# Patient Record
Sex: Male | Born: 1995 | Race: Black or African American | Hispanic: No | Marital: Single | Smoking: Never smoker
Health system: Southern US, Community
[De-identification: ages and names within clinical notes are randomized; demographics above are authoritative.]

## PROBLEM LIST (undated history)

## (undated) DIAGNOSIS — G43909 Migraine, unspecified, not intractable, without status migrainosus: Secondary | ICD-10-CM

## (undated) DIAGNOSIS — R51 Headache: Secondary | ICD-10-CM

## (undated) DIAGNOSIS — R519 Headache, unspecified: Secondary | ICD-10-CM

## (undated) HISTORY — DX: Headache, unspecified: R51.9

## (undated) HISTORY — DX: Migraine, unspecified, not intractable, without status migrainosus: G43.909

## (undated) HISTORY — DX: Headache: R51

---
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
## 2014-11-27 ENCOUNTER — Ambulatory Visit (INDEPENDENT_AMBULATORY_CARE_PROVIDER_SITE_OTHER): Payer: BC Managed Care – PPO | Admitting: Physician Assistant

## 2014-11-27 ENCOUNTER — Encounter (INDEPENDENT_AMBULATORY_CARE_PROVIDER_SITE_OTHER): Payer: Self-pay

## 2014-11-27 VITALS — BP 130/99 | HR 64 | Temp 98.4°F | Resp 19 | Ht 70.0 in | Wt 180.0 lb

## 2014-11-27 DIAGNOSIS — Y998 Other external cause status: Secondary | ICD-10-CM

## 2014-11-27 DIAGNOSIS — R5383 Other fatigue: Secondary | ICD-10-CM

## 2014-11-27 DIAGNOSIS — R21 Rash and other nonspecific skin eruption: Secondary | ICD-10-CM

## 2014-11-27 DIAGNOSIS — Y939 Activity, unspecified: Secondary | ICD-10-CM

## 2014-11-27 DIAGNOSIS — J309 Allergic rhinitis, unspecified: Secondary | ICD-10-CM

## 2014-11-27 DIAGNOSIS — T679XXA Effect of heat and light, unspecified, initial encounter: Secondary | ICD-10-CM

## 2014-11-27 MED ORDER — TRIAMCINOLONE ACETONIDE 0.025 % EX CREA
TOPICAL_CREAM | Freq: Two times a day (BID) | CUTANEOUS | Status: AC
Start: 2014-11-27 — End: ?

## 2014-11-27 NOTE — Progress Notes (Signed)
Subjective:    Patient ID: Charles Donovan is a 19 y.o. male.    HPI    Pt reports staying in this area for the summer, usually lives in [place]. Pt reports feeling fatigued, nauseated, headache, and skin rash. 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 Pt reports he feels hot flashes when doing this work and once he comes inside. Pt reports that he does not take fluids with him when working. Only gets fluids when he comes in for lunch. Pt has spots of itchy rashes on various parts of body. No chronic conditions, no chronic meds.    The following portions of the patient's history were reviewed and updated as appropriate: allergies, current medications, past medical history, past social history, past surgical history and problem list.    Review of Systems   Constitutional: Positive for activity change and fatigue. Negative for fever.        Hot flashes   Respiratory: Negative for cough.    Cardiovascular: Negative for chest pain.   Gastrointestinal: Positive for nausea. Negative for vomiting and diarrhea.   Skin: Positive for rash.   Neurological: Positive for headaches. Negative for dizziness and light-headedness.   All other systems reviewed and are negative.        Objective:    BP 130/99 mmHg  Pulse 64  Temp(Src) 98.4 F (36.9 C) (Oral)  Resp 19  Ht 1.778 m (5\' 10" )  Wt 81.647 kg (180 lb)  BMI 25.83 kg/m2    Physical Exam   Constitutional: He is oriented to person, place, and time. He appears well-developed and well-nourished. No distress.   HENT:   Head: Normocephalic and atraumatic.   Right Ear: Tympanic membrane and ear canal normal.   Left Ear: Tympanic membrane and ear canal normal.   Mouth/Throat: Uvula is midline and mucous membranes are normal. No oropharyngeal exudate, posterior oropharyngeal edema, posterior oropharyngeal erythema or tonsillar abscesses.   Eyes: EOM are normal. Right eye exhibits no discharge. Left eye  exhibits no discharge. No scleral icterus.   Neck: Normal range of motion.   Cardiovascular: Normal rate, regular rhythm and normal heart sounds.  Exam reveals no gallop and no friction rub.    No murmur heard.  Pulmonary/Chest: Effort normal and breath sounds normal. No accessory muscle usage. No respiratory distress. He has no decreased breath sounds. He has no wheezes. He has no rhonchi. He has no rales.   Musculoskeletal: Normal range of motion.   Lymphadenopathy:     He has no cervical adenopathy.   Neurological: He is alert and oriented to person, place, and time. Coordination normal.   Skin: Skin is warm and dry. Rash noted. No purpura noted. Rash is maculopapular. Rash is not nodular, not vesicular and not urticarial. He is not diaphoretic.        Psychiatric: He has a normal mood and affect. His behavior is normal. Judgment and thought content normal.   Nursing note and vitals reviewed.        Assessment and Plan:       [name] was seen today for hot flashes.    Diagnoses and all orders for this visit:    Fatigue, unspecified type    Rash and nonspecific skin eruption  Orders:  -     triamcinolone (KENALOG) 0.025 % cream; Apply topically 2 (two) times daily.    Heat exposure, initial encounter  Allergic rhinitis, unspecified allergic rhinitis type        Plan: Advised pt that he needs to maintain hydration especially with the heat and working outside. Advised pt that he needs to take fluids with him to the job, and not just drink water at lunch. Push fluids.  Ibuprofen for headache.  Advised adding daily Claritin/Zyrtec/or Allegra for skin allergic response.  Triamcinolone BID PRN for rash.  Follow up with PCP or RTC if there are any new or worsening symptoms or if the symptoms are lasting longer than expected.  Patient/guardian expressed understanding and agreement with plan of care at time of discharge.        Arlys John, PA  [facility]  11/27/2014  1:46 PM

## 2014-11-30 ENCOUNTER — Telehealth (INDEPENDENT_AMBULATORY_CARE_PROVIDER_SITE_OTHER): Payer: Self-pay

## 2014-11-30 NOTE — Telephone Encounter (Signed)
Called to check on patient after recent visit. Patient returned phone call and states that he is feeling better and had no questions or concerns.        Charles Donovan  11/30/2014  3:25 PM

## 2014-11-30 NOTE — Telephone Encounter (Signed)
Called to check on patient after recent visit. Left a message to call back if any questions or concerns.    Charles Donovan  11/30/2014  3:18 PM

## 2015-09-10 ENCOUNTER — Encounter (HOSPITAL_COMMUNITY): Payer: Self-pay | Admitting: *Deleted

## 2015-09-10 ENCOUNTER — Emergency Department (HOSPITAL_COMMUNITY)
Admission: EM | Admit: 2015-09-10 | Discharge: 2015-09-10 | Disposition: A | Discharge status: Home / Self Care | Payer: BC Managed Care – PPO | Attending: Emergency Medicine | Admitting: Emergency Medicine

## 2015-09-10 ENCOUNTER — Emergency Department (HOSPITAL_COMMUNITY): Payer: BC Managed Care – PPO

## 2015-09-10 DIAGNOSIS — R079 Chest pain, unspecified: Secondary | ICD-10-CM | POA: Diagnosis present

## 2015-09-10 DIAGNOSIS — R0781 Pleurodynia: Secondary | ICD-10-CM

## 2015-09-10 MED ORDER — IBUPROFEN 800 MG PO TABS
800.0000 mg | ORAL_TABLET | Freq: Three times a day (TID) | ORAL | Status: DC | PRN
Start: 2015-09-10 — End: 2017-01-08

## 2015-09-10 NOTE — ED Notes (Signed)
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

## 2015-09-10 NOTE — Discharge Instructions (Signed)
Return here as needed.  Follow-up with your team doctor, as they may want to do further testing.  Your x-rays here today were negative.  Use ice and heat on the area that is sore

## 2015-09-10 NOTE — ED Notes (Signed)
Patient returned from xray.

## 2015-09-10 NOTE — ED Provider Notes (Signed)
CSN: [id number]     Arrival date & time 09/10/15  [id number] History  By signing my name below, I, Essence Howell, attest that this documentation has been prepared under the direction and in the presence of Charlestine Nighthristopher Alexzandria Massman, PA-C Electronically Signed: Charline BillsEssence Howell, ED Scribe 09/10/2015 at 10:38 AM   Chief Complaint  Patient presents with  . Chest Pain   The history is provided by the patient. No language interpreter was used.   HPI Comments: Aaron Clements is a 20 y.o. male who presents to the Emergency Department complaining of persistent left rib pain for the past month. Pt suspects that he injured the area playing baseball in the summer. He currently rates pain as 4/10 and describes pain as soreness that is exacerbated with palpation. He has been evaluated by his baseball team's physician and had normal XRs last week. He denies fever, cough, rash.   History reviewed. No pertinent past medical history. History reviewed. No pertinent past surgical history. Family History  Problem Relation Age of Onset  . Colon cancer Maternal Grandmother   . Prostate cancer Maternal Grandfather    Social History  Substance Use Topics  . Smoking status: Never Smoker   . Smokeless tobacco: None  . Alcohol Use: No    Review of Systems  A complete 10 system review of systems was obtained and all systems are negative except as noted in the HPI and PMH.  Allergies  Review of patient's allergies indicates no known allergies.  Home Medications   Prior to Admission medications   Medication Sig Start Date End Date Taking? Authorizing Provider  ibuprofen (ADVIL,MOTRIN) 200 MG tablet Take 200 mg by mouth every 6 (six) hours as needed for pain or headache.    Historical Provider, MD   BP 123/78 mmHg  Pulse 54  Temp(Src) 97.9 F (36.6 C) (Oral)  Resp 14  Ht 5\' 9"  (1.753 m)  Wt 187 lb 9 oz (85.078 kg)  BMI 27.69 kg/m2  SpO2 100% Physical Exam  Constitutional: He is oriented to person, place,  and time. He appears well-developed and well-nourished. No distress.  HENT:  Head: Normocephalic and atraumatic.  Eyes: Conjunctivae and EOM are normal.  Neck: Neck supple. No tracheal deviation present.  Cardiovascular: Normal rate.   Pulmonary/Chest: Effort normal. No respiratory distress.  Pain over the lower rib area on the L in the area of the 11th or 12th rib  Musculoskeletal: Normal range of motion.  Neurological: He is alert and oriented to person, place, and time.  Skin: Skin is warm and dry.  Psychiatric: He has a normal mood and affect. His behavior is normal.  Nursing note and vitals reviewed.  ED Course  Procedures (including critical care time) DIAGNOSTIC STUDIES: Oxygen Saturation is 100% on RA, normal by my interpretation.    COORDINATION OF CARE: 9:49 AM-Discussed treatment plan which includes CXR with pt at bedside and pt agreed to plan.    Imaging Review Dg Ribs Unilateral W/chest Left  09/10/2015  CLINICAL DATA:  Left lower rib pain, right handed pitcher EXAM: LEFT RIBS AND CHEST - 3+ VIEW COMPARISON:  None. FINDINGS: Three views left ribs submitted. No infiltrate or pulmonary edema. No left rib fracture is identified. No pneumothorax. IMPRESSION: Negative. Electronically Signed   By: Natasha MeadLiviu  Pop M.D.   On: 09/10/2015 10:33   I have personally reviewed and evaluated these images and lab results as part of my medical decision-making.  Patient is advised follow-up with his team.  Dr. told to return here as needed.  Patient agrees the plan and all questions were answered  Charlestine Night, PA-C 09/10/15 1558  Derwood Kaplan, MD 09/11/15 [phone]

## 2016-02-04 ENCOUNTER — Encounter: Payer: Self-pay | Admitting: Diagnostic Neuroimaging

## 2016-02-04 ENCOUNTER — Telehealth: Payer: Self-pay | Admitting: Diagnostic Neuroimaging

## 2016-02-04 ENCOUNTER — Ambulatory Visit (INDEPENDENT_AMBULATORY_CARE_PROVIDER_SITE_OTHER): Payer: BC Managed Care – PPO | Admitting: Diagnostic Neuroimaging

## 2016-02-04 VITALS — BP 126/66 | HR 49 | Ht 70.0 in | Wt 189.6 lb

## 2016-02-04 DIAGNOSIS — G43109 Migraine with aura, not intractable, without status migrainosus: Secondary | ICD-10-CM | POA: Diagnosis not present

## 2016-02-04 NOTE — Telephone Encounter (Signed)
Father called requesting to speak to someone, states patient didn't tell us the reason why we saw him, was referred to our office for migraines. Please call.

## 2016-02-04 NOTE — Patient Instructions (Addendum)
Thank you for coming to see [place] at [facility]. I hope we have been able to provide you high quality care today.  You may receive a patient satisfaction survey over the next few weeks. We would appreciate your feedback and comments so that we may continue to improve ourselves and the health of our patients.  - monitor and avoid headache triggers (i.e. Skipping meals, eating chocolate) - follow up with eye doctor for glasses evaluation - use ibuprofen or tylenol as needed for breakthrough headaches  To prevent or relieve headaches, try the following:   Cool Compress. Lie down and place a cool compress on your head.   Avoid headache triggers. If certain foods or odors seem to have triggered your migraines in the past, avoid them. A headache diary might help you identify triggers.   Include physical activity in your daily routine.   Manage stress. Find healthy ways to cope with the stressors, such as delegating tasks on your to-do list.   Practice relaxation techniques. Try deep breathing, yoga, massage and visualization.   Eat regularly. Eating regularly scheduled meals and maintaining a healthy diet might help prevent headaches. Also, drink plenty of fluids.   Follow a regular sleep schedule. Sleep deprivation might contribute to headaches  Consider biofeedback. With this mind-body technique, you learn to control certain bodily functions - such as muscle tension, heart rate and blood pressure - to prevent headaches or reduce headache pain.    ~~~~~~~~~~~~~~~~~~~~~~~~~~~~~~~~~~~~~~~~~~~~~~~~~~~~~~~~~~~~~~~~~  DR. Braya Habermehl'S GUIDE TO HAPPY AND HEALTHY LIVING These are some of my general health and wellness recommendations. Some of them may apply to you better than others. Please use common sense as you try these suggestions and feel free to ask me any questions.   ACTIVITY/FITNESS Mental, social, emotional and physical stimulation are very important for brain  and body health. Try learning a new activity (arts, music, language, sports, games).  Keep moving your body to the best of your abilities. You can do this at home, inside or outside, the park, community center, gym or anywhere you like. Consider a physical therapist or personal trainer to get started. Consider the app Sworkit. Fitness trackers such as smart-watches, smart-phones or Fitbits can help as well.   NUTRITION Eat more plants: colorful vegetables, nuts, seeds and berries.  Eat less sugar, salt, preservatives and processed foods.  Avoid toxins such as cigarettes and alcohol.  Drink water when you are thirsty. Warm water with a slice of lemon is an excellent morning drink to start the day.  Consider these websites for more information The Nutrition Source (https://www.henry-hernandez.biz/) Precision Nutrition (WindowBlog.ch)   RELAXATION Consider practicing mindfulness meditation or other relaxation techniques such as deep breathing, prayer, yoga, tai chi, massage. See website mindful.org or the apps Headspace or Calm to help get started.   SLEEP Try to get at least 7-8+ hours sleep per day. Regular exercise and reduced caffeine will help you sleep better. Practice good sleep hygeine techniques. See website sleep.org for more information.   PLANNING Prepare estate planning, living will, healthcare POA documents. Sometimes this is best planned with the help of an attorney. Theconversationproject.org and agingwithdignity.org are excellent resources.

## 2016-02-04 NOTE — Telephone Encounter (Signed)
Unable to call father. No number listed, and he is not on patient's DPR form. Not authorized by patient to speak with father.

## 2016-02-04 NOTE — Progress Notes (Signed)
[facility]  PATIENT: Aaron Clements DOB: 1995-09-19  REFERRING CLINICIAN: Quita Skye James, NP HISTORY FROM: patient  REASON FOR VISIT: new consult    HISTORICAL  CHIEF COMPLAINT:  Chief Complaint  Patient presents with  . Headache    rm 7, New Pt, "eyes when I sleep I see white, bright lights, and it's hard to wake up from; ears ringing; concussion age 89"    HISTORY OF PRESENT ILLNESS:   20 year old male, right-handed, here for evaluation of headaches. Since age 20 years old, following concussion while attending cousins football game and struck in the face with kneepad, leading to loss of consciousness, nausea, vomiting and overnight hospital stay, patient has had intermittent headaches. Patient was headaches all throughout his life, mainly with left-sided throbbing pounding headaches associated with photophobia, left ptosis, left eye pain, left blurred vision. Sometimes these headaches are preceded by seeing "flashing bright lights closed". Triggering factors would include eating chocolate or skipping meals. Headaches can last 1-2 hours or longer this time. Headaches occur approximately 1 day per week. Patient usually takes ibuprofen and lays down with good relief.  No family history of headaches or migraine. No prior diagnosis of migraine.   REVIEW OF SYSTEMS: Full 14 system review of systems performed and negative with exception of: Headache eye pain ringing in ears.  ALLERGIES: No Known Allergies  HOME MEDICATIONS: Outpatient Medications Prior to Visit  Medication Sig Dispense Refill  . ibuprofen (ADVIL,MOTRIN) 800 MG tablet Take 1 tablet (800 mg total) by mouth every 8 (eight) hours as needed. 21 tablet 0   No facility-administered medications prior to visit.     PAST MEDICAL HISTORY: Past Medical History:  Diagnosis Date  . Headache     PAST SURGICAL HISTORY: History reviewed. No pertinent surgical history.  FAMILY HISTORY: Family History    Problem Relation Age of Onset  . Colon cancer Maternal Grandmother   . Prostate cancer Maternal Grandfather     SOCIAL HISTORY:  Social History   Social History  . Marital status: Single    Spouse name: N/A  . Number of children: 0  . Years of education: 15   Occupational History  . student     [employer], criminal justice   Social History Main Topics  . Smoking status: Never Smoker  . Smokeless tobacco: Current User    Types: Chew     Comment: chew off anf on   . Alcohol use No  . Drug use: No  . Sexual activity: Not on file   Other Topics Concern  . Not on file   Social History Narrative   Lives with mother   No caffeine     PHYSICAL EXAM  GENERAL EXAM/CONSTITUTIONAL: Vitals:  Vitals:   02/04/16 0859  BP: 126/66  Pulse: (!) 49  Weight: 189 lb 9.6 oz (86 kg)  Height: 5\' 10"  (1.778 m)     Body mass index is 27.2 kg/m.  Visual Acuity Screening   Right eye Left eye Both eyes  Without correction: 20/20 20/30   With correction:        Patient is in no distress; well developed, nourished and groomed; neck is supple  CARDIOVASCULAR:  Examination of carotid arteries is normal; no carotid bruits  Regular rate and rhythm, no murmurs  Examination of peripheral vascular system by observation and palpation is normal  EYES:  Ophthalmoscopic exam of optic discs and posterior segments is normal; no papilledema or hemorrhages  MUSCULOSKELETAL:  Gait,  strength, tone, movements noted in Neurologic exam below  NEUROLOGIC: MENTAL STATUS:  No flowsheet data found.  awake, alert, oriented to person, place and time  recent and remote memory intact  normal attention and concentration  language fluent, comprehension intact, naming intact,   fund of knowledge appropriate  CRANIAL NERVE:   2nd - no papilledema on fundoscopic exam  2nd, 3rd, 4th, 6th - pupils equal and reactive to light, visual fields full to confrontation, extraocular muscles  intact, no nystagmus  5th - facial sensation symmetric  7th - facial strength symmetric  8th - hearing intact  9th - palate elevates symmetrically, uvula midline  11th - shoulder shrug symmetric  12th - tongue protrusion midline  MOTOR:   normal bulk and tone, full strength in the BUE, BLE  SENSORY:   normal and symmetric to light touch, temperature, vibration  COORDINATION:   finger-nose-finger, fine finger movements normal  REFLEXES:   deep tendon reflexes present and symmetric  GAIT/STATION:   narrow based gait; able to walk tandem; romberg is negative    DIAGNOSTIC DATA (LABS, IMAGING, TESTING) - I reviewed patient records, labs, notes, testing and imaging myself where available.  No results found for: WBC, HGB, HCT, MCV, PLT No results found for: NA, K, CL, CO2, GLUCOSE, BUN, CREATININE, CALCIUM, PROT, ALBUMIN, AST, ALT, ALKPHOS, BILITOT, GFRNONAA, GFRAA No results found for: CHOL, HDL, LDLCALC, LDLDIRECT, TRIG, CHOLHDL No results found for: ZOXW9U No results found for: VITAMINB12 No results found for: TSH     ASSESSMENT AND PLAN  20 y.o. year old male here with migraine with aura since age 41 years old following concussion. Neurologic examination is unremarkable. Most likely represents migraine with aura. No further neuroimaging or testing advised. Offered options for migraine prevention migraine rescue medication. Patient feels comfortable using ibuprofen and avoiding triggering factors. If symptoms change or worsen patient may return for further evaluation.   Dx:  1. Migraine with aura and without status migrainosus, not intractable      PLAN: - monitor and avoid headache triggers (i.e. Skipping meals, eating chocolate) - follow up with eye doctor for glasses evaluation - use ibuprofen or tylenol as needed for breakthrough headaches  No orders of the defined types were placed in this encounter.   No orders of the defined types were placed in  this encounter.   Return if symptoms worsen or fail to improve, for return to PCP.    Suanne Marker, MD 02/04/2016, 9:36 AM Certified in Neurology, Neurophysiology and Neuroimaging  [facility] [address], [address] [place], [place] [address] [phone]

## 2016-02-05 NOTE — Telephone Encounter (Signed)
Pt's father called back. I relayed to him that he is not on DPR so we can not speak to him about his son's care. I told him the pt will have to come to the office to fill and sign form. He expressed understanding

## 2017-01-07 ENCOUNTER — Encounter (HOSPITAL_BASED_OUTPATIENT_CLINIC_OR_DEPARTMENT_OTHER): Payer: Self-pay | Admitting: Emergency Medicine

## 2017-01-07 ENCOUNTER — Emergency Department (HOSPITAL_BASED_OUTPATIENT_CLINIC_OR_DEPARTMENT_OTHER)
Admission: EM | Admit: 2017-01-07 | Discharge: 2017-01-07 | Disposition: A | Discharge status: Home / Self Care | Payer: Worker's Compensation | Attending: Emergency Medicine | Admitting: Emergency Medicine

## 2017-01-07 DIAGNOSIS — F17220 Nicotine dependence, chewing tobacco, uncomplicated: Secondary | ICD-10-CM | POA: Diagnosis not present

## 2017-01-07 DIAGNOSIS — Y939 Activity, unspecified: Secondary | ICD-10-CM | POA: Diagnosis not present

## 2017-01-07 DIAGNOSIS — W228XXA Striking against or struck by other objects, initial encounter: Secondary | ICD-10-CM | POA: Diagnosis not present

## 2017-01-07 DIAGNOSIS — Y929 Unspecified place or not applicable: Secondary | ICD-10-CM | POA: Insufficient documentation

## 2017-01-07 DIAGNOSIS — Y990 Civilian activity done for income or pay: Secondary | ICD-10-CM | POA: Insufficient documentation

## 2017-01-07 DIAGNOSIS — S060X0A Concussion without loss of consciousness, initial encounter: Secondary | ICD-10-CM

## 2017-01-07 DIAGNOSIS — S0990XA Unspecified injury of head, initial encounter: Secondary | ICD-10-CM | POA: Diagnosis present

## 2017-01-07 NOTE — ED Notes (Signed)
Pt reports feeling dizzy upon exertion not while resting.

## 2017-01-07 NOTE — ED Notes (Signed)
Pt verbalizes understanding of d/c instructions and denies any further needs at this time. 

## 2017-01-07 NOTE — ED Triage Notes (Signed)
Pt reports he was hit in the L side of the head with a metal latch at work this morning. Denies LOC. Swelling noted. Pt reports pain and dizziness.

## 2017-01-07 NOTE — ED Provider Notes (Signed)
[facility] Provider Note   CSN: [id number] Arrival date & time: 01/07/17  4098  By signing my name below, I, Diona Browner, attest that this documentation has been prepared under the direction and in the presence of Tilden Fossa, MD. Electronically Signed: Diona Browner, ED Scribe. 01/07/17. 9:10 PM.  History   Chief Complaint Chief Complaint  Patient presents with  . Head Injury    HPI Aaron Clements is a 21 y.o. male who presents to the Emergency Department complaining of a mild HA that started this morning after he was hit in the head by a metal latch at work. Pt reports feeling nauseous earlier in the day. Notes he hasn't eaten since lunch, but he currently feels better, and would like to go home. Pt denies LOC, vomiting, numbness, weakness, or any other complaints at this time.   The history is provided by the patient and a parent (Mother). No language interpreter was used.    Past Medical History:  Diagnosis Date  . Headache     Patient Active Problem List   Diagnosis Date Noted  . Unspecified constipation 12/19/2012    History reviewed. No pertinent surgical history.     Home Medications    Prior to Admission medications   Medication Sig Start Date End Date Taking? Authorizing Provider  ibuprofen (ADVIL,MOTRIN) 800 MG tablet Take 1 tablet (800 mg total) by mouth every 8 (eight) hours as needed. 09/10/15   Charlestine Night, PA-C    Family History Family History  Problem Relation Age of Onset  . Colon cancer Maternal Grandmother   . Prostate cancer Maternal Grandfather     Social History Social History  Substance Use Topics  . Smoking status: Never Smoker  . Smokeless tobacco: Current User    Types: Chew     Comment: chew off anf on   . Alcohol use No     Allergies   Patient has no known allergies.   Review of Systems Review of Systems  Gastrointestinal: Positive for nausea. Negative for vomiting.  Neurological:  Positive for headaches. Negative for syncope, weakness and numbness.  All other systems reviewed and are negative.    Physical Exam Updated Vital Signs BP 132/75 (BP Location: Left Arm)   Pulse (!) 48   Temp 98.4 F (36.9 C) (Oral)   Resp 16   Ht 5\' 9"  (1.753 m)   Wt 88.5 kg (195 lb)   SpO2 100%   BMI 28.80 kg/m   Physical Exam  Constitutional: He is oriented to person, place, and time. He appears well-developed and well-nourished.  HENT:  Head: Normocephalic.  Right Ear: Tympanic membrane normal.  Left Ear: Tympanic membrane normal.  Mild swelling and tenderness to left parietal region.  Eyes: Pupils are equal, round, and reactive to light. EOM are normal.  Cardiovascular: Normal rate and regular rhythm.   No murmur heard. Pulmonary/Chest: Effort normal and breath sounds normal. No respiratory distress.  Musculoskeletal: He exhibits no edema or tenderness.  Neurological: He is alert and oriented to person, place, and time.  Skin: Skin is warm and dry.  Psychiatric: He has a normal mood and affect. His behavior is normal.  Nursing note and vitals reviewed.    ED Treatments / Results  DIAGNOSTIC STUDIES: Oxygen Saturation is 100% on RA, normal by my interpretation.   COORDINATION OF CARE: 9:10 PM-Discussed next steps with pt which includes taking tylenol or ibuprofen for pain. If his sx worsen or change he is to  return for a CT scan. He is advised to stay home from work and will be given a note. Pt verbalized understanding and is agreeable with the plan.   Labs (all labs ordered are listed, but only abnormal results are displayed) Labs Reviewed - No data to display  EKG  EKG Interpretation None       Radiology No results found.  Procedures Procedures (including critical care time)  Medications Ordered in ED Medications - No data to display   Initial Impression / Assessment and Plan / ED Course  I have reviewed the triage vital signs and the nursing  notes.  Pertinent labs & imaging results that were available during my care of the patient were reviewed by me and considered in my medical decision making (see chart for details).     Patient here for evaluation following a head injury. No concerning features for serious closed head injury. Discussed with patient and home care for concussion. Discussed outpatient follow-up and return precautions.  Final Clinical Impressions(s) / ED Diagnoses   Final diagnoses:  Concussion without loss of consciousness, initial encounter    New Prescriptions Discharge Medication List as of 01/07/2017  9:11 PM     I personally performed the services described in this documentation, which was scribed in my presence. The recorded information has been reviewed and is accurate.    Tilden Fossaees, Latanga Nedrow, MD 01/08/17 [phone]

## 2017-01-08 ENCOUNTER — Ambulatory Visit (INDEPENDENT_AMBULATORY_CARE_PROVIDER_SITE_OTHER): Payer: BC Managed Care – PPO | Admitting: Family Medicine

## 2017-01-08 ENCOUNTER — Encounter: Payer: Self-pay | Admitting: Family Medicine

## 2017-01-08 VITALS — BP 116/70 | HR 56 | Temp 98.5°F | Ht 69.0 in | Wt 183.4 lb

## 2017-01-08 DIAGNOSIS — S060X0D Concussion without loss of consciousness, subsequent encounter: Secondary | ICD-10-CM | POA: Diagnosis not present

## 2017-01-08 DIAGNOSIS — Z7689 Persons encountering health services in other specified circumstances: Secondary | ICD-10-CM

## 2017-01-08 DIAGNOSIS — S060X9A Concussion with loss of consciousness of unspecified duration, initial encounter: Secondary | ICD-10-CM | POA: Insufficient documentation

## 2017-01-08 DIAGNOSIS — S060XAA Concussion with loss of consciousness status unknown, initial encounter: Secondary | ICD-10-CM | POA: Insufficient documentation

## 2017-01-08 NOTE — Patient Instructions (Addendum)
Concussion, Adult A concussion is a brain injury from a direct hit (blow) to the head or body. This blow causes the brain to shake quickly back and forth inside the skull. This can damage brain cells and cause chemical changes in the brain. A concussion may also be known as a mild traumatic brain injury (TBI). Concussions are usually not life-threatening, but the effects of a concussion can be serious. If you have a concussion, you are more likely to experience concussion-like symptoms after a direct blow to the head in the future. What are the causes? This condition is caused by:  A direct blow to the head, such as from running into another player during a game, being hit in a fight, or hitting your head on a hard surface.  A jolt of the head or neck that causes the brain to move back and forth inside the skull, such as in a car crash.  What are the signs or symptoms? The signs of a concussion can be hard to notice. Early on, they may be missed by you, family members, and health care providers. You may look fine but act or feel differently. Symptoms are usually temporary, but they may last for days, weeks, or even longer. Some symptoms may appear right away but other symptoms may not show up for hours or days. Every head injury is different. Symptoms may include:  Headaches. This can include a feeling of pressure in the head.  Memory problems.  Trouble concentrating, organizing, or making decisions.  Slowness in thinking, acting or reacting, speaking, or reading.  Confusion.  Fatigue.  Changes in eating or sleeping patterns.  Problems with coordination or balance.  Nausea or vomiting.  Numbness or tingling.  Sensitivity to light or noise.  Vision or hearing problems.  Reduced sense of smell.  Irritability or mood changes.  Dizziness.  Lack of motivation.  Seeing or hearing things that other people do not see or hear (hallucinations).  How is this diagnosed? This  condition is diagnosed based on:  Your symptoms.  A description of your injury.  You may also have tests, including:  Imaging tests, such as a CT scan or MRI. These are done to look for signs of brain injury.  Neuropsychological tests. These measure your thinking, understanding, learning, and remembering abilities.  How is this treated? This condition is treated with physical and mental rest and careful observation, usually at home. If the concussion is severe, you may need to stay home from work for a while. You may be referred to a concussion clinic or to other health care providers for management. It is important that you tell your health care provider if:  You are taking any medicines, including prescription medicines, over-the-counter medicines, and natural remedies. Some medicines, such as blood thinners (anticoagulants) and aspirin, may increase the chance of complications, such as bleeding.  You are taking or have taken alcohol or illegal drugs. Alcohol and certain other drugs may slow your recovery and can put you at risk of further injury.  How fast you will recover from a concussion depends on many factors, such as how severe your concussion is, what part of your brain was injured, how old you are, and how healthy you were before the concussion. Recovery can take time. It is important to wait to return to activity until a health care provider says it is safe to do that and your symptoms are completely gone. Follow these instructions at home: Activity  Limit activities that   require a lot of thought or concentration. These may include: ? Doing homework or job-related work. ? Watching TV. ? Working on the computer. ? Playing memory games and puzzles.  Rest. Rest helps the brain to heal. Make sure you: ? Get plenty of sleep at night. Avoid staying up late at night. ? Keep the same bedtime hours on weekends and weekdays. ? Rest during the day. Take naps or rest breaks when you  feel tired.  Having another concussion before the first one has healed can be dangerous. Do not do high-risk activities that could cause a second concussion, such as riding a bicycle or playing sports.  Ask your health care provider when you can return to your normal activities, such as school, work, athletics, driving, riding a bicycle, or using heavy machinery. Your ability to react may be slower after a brain injury. Never do these activities if you are dizzy. Your health care provider will likely give you a plan for gradually returning to activities. General instructions  Take over-the-counter and prescription medicines only as told by your health care provider.  Do not drink alcohol until your health care provider says you can.  If it is harder than usual to remember things, write them down.  If you are easily distracted, try to do one thing at a time. For example, do not try to watch TV while fixing dinner.  Talk with family members or close friends when making important decisions.  Watch your symptoms and tell others to do the same. Complications sometimes occur after a concussion. Older adults with a brain injury may have a higher risk of serious complications, such as a blood clot in the brain.  Tell your teachers, school nurse, school counselor, coach, athletic trainer, or work manager about your injury, symptoms, and restrictions. Tell them about what you can or cannot do. They should watch for: ? Increased problems with attention or concentration. ? Increased difficulty remembering or learning new information. ? Increased time needed to complete tasks or assignments. ? Increased irritability or decreased ability to cope with stress. ? Increased symptoms.  Keep all follow-up visits as told by your health care provider. This is important. How is this prevented? It is very important to avoid another brain injury, especially as you recover. In rare cases, another injury can lead  to permanent brain damage, brain swelling, or death. The risk of this is greatest during the first 7-10 days after a head injury. Avoid injuries by:  Wearing a seat belt when riding in a car.  Wearing a helmet when biking, skiing, skateboarding, skating, or doing similar activities.  Avoiding activities that could lead to a second concussion, such as contact or recreational sports, until your health care provider says it is okay.  Taking safety measures in your home, such as: ? Removing clutter and tripping hazards from floors and stairways. ? Using grab bars in bathrooms and handrails by stairs. ? Placing non-slip mats on floors and in bathtubs. ? Improving lighting in dim areas.  Contact a health care provider if:  Your symptoms get worse.  You have new symptoms.  You continue to have symptoms for more than 2 weeks. Get help right away if:  You have severe or worsening headaches.  You have weakness or numbness in any part of your body.  Your coordination gets worse.  You vomit repeatedly.  You are sleepier.  The pupil of one eye is larger than the other.  You have convulsions or a   seizure.  Your speech is slurred.  Your fatigue, confusion, or irritability gets worse.  You cannot recognize people or places.  You have neck pain.  It is difficult to wake you up.  You have unusual behavior changes.  You lose consciousness. Summary  A concussion is a brain injury from a direct hit (blow) to the head or body.  A concussion may also be called a mild traumatic brain injury (TBI).  You may have imaging tests and neuropsychological tests to diagnose a concussion.  This condition is treated with physical and mental rest and careful observation.  Ask your health care provider when you can return to your normal activities, such as school, work, athletics, driving, riding a bicycle, or using heavy machinery. Follow safety instructions as told by your health care  provider. This information is not intended to replace advice given to you by your health care provider. Make sure you discuss any questions you have with your health care provider. Document Released: 08/08/2003 Document Revised: 04/28/2016 Document Reviewed: 04/28/2016 Elsevier Interactive Patient Education  2017 [employer]     The [facility] has a sports medicine clinic that has a concussion clinic.  The number to the office is [phone].  You should expect a phone call from them on Monday, but if you have not heard from them by the afternoon please give them a call.  If you have any worsening symptoms over the weekend please return to the urgent care clinic or go to the ED for further evaluation.

## 2017-01-08 NOTE — Progress Notes (Signed)
Subjective:    Patient ID: Aaron Clements, male    DOB: Sep 19, 1995, 21 y.o.   MRN: [id number]  Chief Complaint  Patient presents with  . Establish Care    HPI Patient is in today to establish care.  Pt previously seen on [facility] campus.  Pt states he has been healthy.  Patient endorses headaches.  States may have 1-2 per week. They can last all day. May take Tylenol 500 mg and go to sleep.  Of note yesterday patient was seen by urgent care after being injured at work. Patient states a metal lock fell on his head. Pt denies LOC.  Patient states he was diagnosed with a concussion. Patient states a CT scan was not done. Patient states today he feels okay but his head feels weird. Patient denies nausea/vomiting or new headaches.   Patient also presents for form completion. 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   Social history: Patient currently works for [employer] in [place].  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 Patient denies tobacco, drug or ethanol use.  Surgical history: None  Allergies: No known drug allergies, no known food allergies  Family medical history: Maternal grandfather alive history of prostate cancer Maternal grandmother alive history of colon cancer Mother alive and well Father alive and well Older Sister alive and well  Past Medical History:  Diagnosis Date  . Headache   . Migraines     History reviewed. No pertinent surgical history.  Family History  Problem Relation Age of Onset  . Colon cancer Maternal Grandmother   . Cancer Maternal Grandmother   . Prostate cancer Maternal Grandfather   . Cancer Maternal Grandfather     Social History   Social History  . Marital status: Single    Spouse name: N/A  . Number of children: 0  . Years of education: 15    Occupational History  . student     [employer], criminal justice  . [employer]   Social History Main Topics  . Smoking status: Never Smoker  . Smokeless tobacco: Current User    Types: Chew     Comment: chew off anf on   . Alcohol use No  . Drug use: No  . Sexual activity: Not on file   Other Topics Concern  . Not on file   Social History Narrative   Lives with mother   No caffeine    Outpatient Medications Prior to Visit  Medication Sig Dispense Refill  . ibuprofen (ADVIL,MOTRIN) 800 MG tablet Take 1 tablet (800 mg total) by mouth every 8 (eight) hours as needed. 21 tablet 0   No facility-administered medications prior to visit.     No Known Allergies  ROS  General: Denies fever, chills, night sweats, changes in weight, changes in appetite   HEENT: Denies ear pain, changes in vision, rhinorrhea, sore throat    + headaches, concussion CV: Denies CP, palpitations, SOB, orthopnea Pulm: Denies SOB, cough, wheezing GI: Denies abdominal pain, nausea, vomiting, diarrhea, constipation GU: Denies dysuria, hematuria, frequency, vaginal discharge Msk: Denies muscle cramps, joint pains Neuro: Denies weakness, numbness, tingling Skin: Denies rashes, bruising Psych: Denies depression, anxiety, hallucinations      Objective:    Blood pressure 116/70, pulse (!) 56, temperature 98.5 F (36.9 C), temperature source Oral, height  5\' 9"  (1.753 m), weight 183 lb 6.4 oz (83.2 kg), SpO2 98 %.   Gen. Pleasant, well-nourished, in no distress, normal affect  HEENT -[place]/AT, no lesions, face symmetric, no scleral icterus, PERRLA, EOMI, no post nasal drip  Neck: No JVD, no thyromegaly, no carotid bruits Lungs: no accessory muscle uss, CTAB, no wheezes or rales Cardiovascular: RR, heart sounds  normal, no m/r/g, no peripheral edema Abdomen: soft and non-tender, no hepatosplenomegaly, BS normal. Musculoskeletal: No deformities, no cyanosis or clubbing, normal tone Neuro:  A&Ox3, CN  II-XII intact, normal gait,  Skin:  Warm, no lesions/ rash   Wt Readings from Last 3 Encounters:  01/08/17 183 lb 6.4 oz (83.2 kg)  01/07/17 195 lb (88.5 kg)  02/04/16 189 lb 9.6 oz (86 kg)    Diabetic Foot Exam - Simple   No data filed     No results found for: WBC, HGB, HCT, PLT, GLUCOSE, CHOL, TRIG, HDL, LDLDIRECT, LDLCALC, ALT, AST, NA, K, CL, CREATININE, BUN, CO2, TSH, PSA, INR, GLUF, HGBA1C, MICROALBUR  Assessment/Plan: 21 yo healthy, AAM presents to establish care, recent concussion.  Encounter to establish care -Encouraged to follow-up as needed -We will discuss flu shot at next visit.  Mild concussion, subsequent encounter -Stable -We'll have patient follow-up in concussion clinic with sports medicine [facility].  Message left for their staff to contact pt on Monday.  Pt also given clinics phone number to check on appointment. -Patient's form for work  to be completed after completion of concussion protocol. -Patient advised if symptoms become worse has nausea or vomiting new headache, to return to urgent care or the emergency department.

## 2017-01-11 ENCOUNTER — Telehealth: Payer: Self-pay

## 2017-01-11 NOTE — Telephone Encounter (Signed)
Patient returned call. He suffered a head injury last week around the 8th of August while at work. He works and [employer] and was unloading a truck when a flap came down and hit him in the head on the left temporal region. He did not lose consciousness but did go home. He developed a headache, nausea, and felt light headed. These symptoms lasted for 2 days and then all symptoms resolved. He returned to work on Saturday and did not have reoccurrence of symptoms with the physical activity of his job. He does have a prior history of concussion when he was younger. Recommended to patient to refrain from situations in which he would possibly his his head again as best he can while at work. Patient voices understanding. We will see him on Wednesday.

## 2017-01-11 NOTE — Telephone Encounter (Signed)
Dr[facility] Salomon [facility]ickBanks [facility] called to refer Aaron CollegeKelvin to the Concussion Clinic. Called patient to schedule. Left message to call back.

## 2017-01-13 ENCOUNTER — Ambulatory Visit: Payer: Self-pay | Admitting: Family Medicine

## 2017-01-18 ENCOUNTER — Encounter (HOSPITAL_COMMUNITY): Payer: Self-pay | Admitting: *Deleted

## 2017-01-18 ENCOUNTER — Emergency Department (HOSPITAL_COMMUNITY)
Admission: EM | Admit: 2017-01-18 | Discharge: 2017-01-18 | Disposition: A | Discharge status: Home / Self Care | Payer: BC Managed Care – PPO | Attending: Emergency Medicine | Admitting: Emergency Medicine

## 2017-01-18 DIAGNOSIS — L03011 Cellulitis of right finger: Secondary | ICD-10-CM | POA: Insufficient documentation

## 2017-01-18 DIAGNOSIS — M79644 Pain in right finger(s): Secondary | ICD-10-CM | POA: Diagnosis present

## 2017-01-18 MED ORDER — LIDOCAINE HCL 2 % IJ SOLN
10.0000 mL | Freq: Once | INTRAMUSCULAR | Status: DC
  Filled 2017-01-18: qty 10

## 2017-01-18 MED ORDER — LIDOCAINE HCL (PF) 2 % IJ SOLN
10.0000 mL | Freq: Once | INTRAMUSCULAR | Status: AC
  Administered 2017-01-18: 10 mL
  Filled 2017-01-18: qty 10

## 2017-01-18 MED ORDER — SULFAMETHOXAZOLE-TRIMETHOPRIM 800-160 MG PO TABS: 1.0000 | ORAL_TABLET | Freq: Two times a day (BID) | ORAL | 0 refills | Status: DC

## 2017-01-18 NOTE — Discharge Instructions (Signed)
Return if any problems.

## 2017-01-18 NOTE — ED Triage Notes (Signed)
Pt has swelling and pain on middle finger rt hand, along lateral nailbed. Tender to touch

## 2017-01-18 NOTE — ED Notes (Signed)
Telfa dressing applied to finger

## 2017-01-18 NOTE — ED Provider Notes (Signed)
[facility]-EMERGENCY DEPT Provider Note   CSN: [id number] Arrival date & time: 01/18/17  2011     History   Chief Complaint Chief Complaint  Patient presents with  . Finger Injury    HPI Aaron Clements is a 21 y.o. male.  The history is provided by the patient. No language interpreter was used.  Hand Pain  This is a new problem. The current episode started more than 2 days ago. The problem occurs constantly. The problem has been gradually worsening. Nothing aggravates the symptoms. Nothing relieves the symptoms. He has tried nothing for the symptoms. The treatment provided no relief.  Pt complains of swelling around finger  Past Medical History:  Diagnosis Date  . Headache   . Migraines     Patient Active Problem List   Diagnosis Date Noted  . Mild concussion 01/08/2017  . Unspecified constipation 12/19/2012    History reviewed. No pertinent surgical history.     Home Medications    Prior to Admission medications   Medication Sig Start Date End Date Taking? Authorizing Provider  Acetaminophen (TYLENOL PO) Take by mouth as needed.    [provider]    Family History Family History  Problem Relation Age of Onset  . Colon cancer Maternal Grandmother   . Cancer Maternal Grandmother   . Prostate cancer Maternal Grandfather   . Cancer Maternal Grandfather     Social History Social History  Substance Use Topics  . Smoking status: Never Smoker  . Smokeless tobacco: Never Used  . Alcohol use No     Allergies   Patient has no known allergies.   Review of Systems Review of Systems  All other systems reviewed and are negative.    Physical Exam Updated Vital Signs BP (!) 145/83 (BP Location: Left Arm)   Pulse (!) 50   Temp 98.5 F (36.9 C) (Oral)   Resp 16   Ht 5\' 9"  (1.753 m)   Wt 83.9 kg (185 lb)   SpO2 99%   BMI 27.32 kg/m   Physical Exam  Constitutional: He is oriented to person, place, and time. He appears well-developed and  well-nourished.  Musculoskeletal: Normal range of motion.  Neurological: He is alert and oriented to person, place, and time.  Skin: Skin is warm.  Psychiatric: He has a normal mood and affect.  Nursing note and vitals reviewed.    ED Treatments / Results  Labs (all labs ordered are listed, but only abnormal results are displayed) Labs Reviewed - No data to display  EKG  EKG Interpretation None       Radiology No results found.  Procedures .Marland KitchenIncision and Drainage Date/Time: 01/22/2017 4:10 PM Performed by: Elson Areas Authorized by: Elson Areas   Consent:    Consent obtained:  Verbal   Consent given by:  Patient   Risks discussed:  Incomplete drainage   Alternatives discussed:  No treatment Location:    Type:  Abscess   Location:  Upper extremity Pre-procedure details:    Skin preparation:  Betadine Anesthesia (see MAR for exact dosages):    Anesthesia method:  Local infiltration Procedure type:    Complexity:  Simple Procedure details:    Scalpel blade:  11   Wound treatment:  Wound left open   (including critical care time)  Medications Ordered in ED Medications  lidocaine (XYLOCAINE) 2 % (with pres) injection 200 mg (not administered)     Initial Impression / Assessment and Plan / ED Course  I have reviewed the triage vital signs and the nursing notes.  Pertinent labs & imaging results that were available during my care of the patient were reviewed by me and considered in my medical decision making (see chart for details).       Final Clinical Impressions(s) / ED Diagnoses   Final diagnoses:  Paronychia of right index finger    New Prescriptions New Prescriptions   No medications on file  Scheduled Meds: Continuous Infusions: PRN Meds:. An After Visit Summary was printed and given to the patient.    Elson Areas, [place] 01/22/17 1610    Pricilla Loveless, MD 01/25/17 [phone]

## 2017-01-19 NOTE — Progress Notes (Deleted)
Subjective:   @VITALSMCOMMENTS @  Chief Complaint: Aaron Clements, DOB: 31-Jul-1995, is a 21 y.o. male who presents for No chief complaint on file.   Injury date : *** Visit #: ***  History of Present Illness:   Patient's goals/priorities: ***  CLASS CURRENT GRADE COMMENTS                               Concussion Self-Reported Symptom Score Symptoms rated on a scale 1-6, in last 24 hours  Headache: ***    Nausea: ***  Vomiting: ***  Balance Difficulty: ***   Dizziness: ***  Fatigue: ***  Trouble Falling Asleep: ***   Sleep More Than Usual: ***  Sleep Less Than Usual: ***  Daytime Drowsiness: ***  Photophobia: ***  Phonophobia: ***  Feeling anxious: ***  Confused: ***  Irritability: ***  Sadness: ***  Nervousness: ***  Feeling More Emotional: ***  Numbness or Tingling: ***  Feeling Slowed Down: ***  Feeling Mentally Foggy: ***  Difficulty Concentrating: ***  Difficulty Remembering: ***  Visual Problems: ***  Neck Pain: ***  Tinnitus: ***   Total Symptom Score: *** Previous Symptom Score: ***  Review of Systems: Pertinent items are noted in HPI.  Review of History: Past Medical History: @PMHP @  Past Surgical History:  has no past surgical history on file. Family History: family history includes Cancer in his maternal grandfather and maternal grandmother; Colon cancer in his maternal grandmother; Prostate cancer in his maternal grandfather. Social History:  reports that he has never smoked. He has never used smokeless tobacco. He reports that he does not drink alcohol or use drugs. Current Medications: has a current medication list which includes the following prescription(s): acetaminophen and sulfamethoxazole-trimethoprim. Allergies: has No Known Allergies.  Objective:    Physical Examination There were no vitals filed for this visit. General appearance: alert, appears stated age and cooperative Head: Normocephalic, without obvious  abnormality, atraumatic Eyes: conjunctivae/corneas clear. PERRL, EOM's intact. Fundi benign. Sclera anicteric. Lungs: clear to auscultation bilaterally and percussion Heart: regular rate and rhythm, S1, S2 normal, no murmur, click, rub or gallop Neurologic: CN 2-12 normal.  Sensation to pain, touch, and proprioception normal.  DTRs  normal in upper and lower extremities. No pathologic reflexes. Neg rhomberg, modified rhomberg, pronator drift, tandem gait, finger-to-nose; see post-concussion vestibular and oculomotor testing in chart Psychiatric: Oriented X3, intact recent and remote memory, judgement and insight, normal mood and affect  Concussion testing performed today:  Neurocognitive testing (ImPACT):  Baseline:*** Post #1: ***   Verbal Memory Composite *** (***%) *** (***%)   Visual Memory Composite *** (***%) *** (***%)   Visual Motor Speed Composite *** (***%) *** (***%)   Reaction Time Composite *** (***%) *** (***%)   Cognitive Efficiency Index *** ***     Vestibular Screening:   Pre VOMS  HA Score: *** Pre VOMS  Dizziness Score: ***   Headache  Dizziness  Smooth Pursuits *** ***  H. Saccades *** ***  V. Saccades *** ***  H. VOR *** ***  V. VOR *** ***  Visual Motor Sensitivity *** ***  Accommodation Right: *** cm Left: *** cm *** ***  Convergence: *** cm Divergence: *** cm *** ***   Balance Screen: ***  Additional testing performed today: { :28529}   Assessment:    No diagnosis found.  Aaron Clements presents with the following concussion subtypes. [] Cognitive [] Cervical [] Vestibular [] Ocular [] Migraine [] Anxiety/Mood  Plan:   Action/Discussion: Reviewed diagnosis, management options, expected outcomes, and the reasons for scheduled and emergent follow-up. Questions were adequately answered. Patient expressed verbal understanding and agreement with the following plan.      Participation in school/work: Patient is cleared to return to  work/school and activities of daily living without restrictions.  Patient is not cleared to return to work/school until further notice.  Patient may return to work/school on ***, with the following restrictions/supports:  Length of Day:  Please allow patient to use *** class as study hall in a quiet area.  Shortened school/work day: Recommend *** until ***.  Recommend core classes only.  Extra Time:  Take mental rest breaks during the day as needed. Check for return of symptoms when participating in any activities that require a significant amount of attention or concentration.  Allow extra time to complete tasks.  Please allow *** weeks to make up missed assignments, test, quizzes.  Visual/Vestibular Accommodations in School:  Allow patient to eat lunch in quiet environment with 1-2 classmates.  Allow patient to leave class 5 minutes before end of period to avoid busy/noisy hallway.  Please provide any supplemental learning materials (power points, lecture notes, handouts, etc) in minimum size 18 font and allow/provide any auditory supplements to learning when possible (books on tape, audio tape lectures, etc) to limit visual stress in the classroom.  Patient is cleared for auditory participation only. Patient is not cleared for homework, quizzes, or tests at this time.   Testing:  May begin taking tests/quizzes on *** with no more than one test/quiz per day.   No significant classroom or standardized testing until ***.  Home/Extracurricular:  Lessen work/homework load to allow adequate cognitive rest. Work *** minutes with intervals of *** minute breaks (total *** hours).  Limit visual stimulants including: driving, watching television/movies, reading, using cell phone, etc. - to ensure relative visual cognitive rest. NOT cleared for video or phone games. May participate *** minutes with intervals of *** minute breaks (total *** hours).   Participation in physical  activity: Patient is cleared to return to physical activity participation without restrictions.  Patient is not cleared for formal physical activity (includes physical education class, sports practices, sports games, weight training, etc) at this time.   However, we recommend that patient has 20-30 minutes of light cardiovascular activity daily, with NO risk of head injury (example: walking), staying below level of symptoms.  Recommend gradual progression to *** vestibular exercise (30-45 min/day) with no risk of head trauma while staying below level of symptoms.  See your exercise treatment menu for details.   Begin your exercise prescription on ____________ (see separate exercise prescription form)  Cleared for physical activity that poses NO RISK of head trauma.   Gradual return to physical activity under the supervision of a physician and/or athletic trainer  Once asymptomatic for 24 hours, patient may start Stage *** of CFPSM's { :10024} Exercise Progression Protocol. This is to be monitored by patient's { :28383}    Patient may start Stage *** of CFPSM's { :10024} Exercise Progression Protocol. This is to be monitored by patient's { :28383}.   Patient is not cleared for full contact activities, activities with risk of head trauma or unsupervised physical activity while participating in CFPSM's Exercise Progression. Check for return of symptoms (using Concussion Education Form Symptom List) when participating in activity and 24 hours following. If symptoms return, patient to contact our office for further recommendations.    [facility] Medical Recommendations  form has been given to patient allowing *** to progress patient back into full participation.  Active Treatment Strategies:  Fueling your brain is important for recovery. It is essential to stay well hydrated, aiming for half of your body weight in fluid ounces per day (100 lbs = 50 oz). We also recommend eating breakfast to start  your day and focus on a well-balanced diet containing lean protein, 'good' fats, and complex carbohydrates. See your nutrition / hydration handout for more details.   Quality sleep is vital in your concussion recovery. We encourage lots of sleep for the first 24-72 hours after injury but following this period it is important to regulate your sleep cycle. We encourage *** hours of quality sleep per night. See your sleep handout for more details and strategies to quality sleep.  IF NOT USING THE OPTIONS BELOW DELETE THEM  Treating your vestibular and visual dysfunction will decrease your recovery time and improve your symptoms. Begin your home vestibular exercise program as directed on your AVS.    Begin taking Amantadine medicine as directed.   Begin taking DHA supplement as directed.    Begin home exercise program for neck as directed.   Follow-up information:  Follow up appointment at [facility] in *** .   Call [facility] at [phone] at least 24 hours after completion of Stage 4 with status update.  Patient needs to arrive 30 minutes prior to appointment to complete the following tests: { :28378}.    Patient Education:  Reviewed with patient the risks (i.e, a repeat concussion, post-concussion syndrome, second-impact syndrome) of returning to play prior to complete resolution, and thoroughly reviewed the signs and symptoms of concussion.Reviewed need for complete resolution of all symptoms, with rest AND exertion, prior to return to play.  Reviewed red flags for urgent medical evaluation: worsening symptoms, nausea/vomiting, intractable headache, musculoskeletal changes, focal neurological deficits.  [facility]'s Concussion Care Plan, which clearly outlines the plans stated above, was given to patient.  I was personally involved with the physical evaluation of and am in agreement with the assessment and treatment plan for this patient.   Greater than 50% of this encounter was spent in direct consultation with the patient in evaluation, counseling, and coordination of care. Duration of encounter: { :28531} minutes.  After Visit Summary printed out and provided to patient as appropriate.  This note is written by Judi Saa, in the presence of and acting as the scribe of Judi Saa, DO.

## 2017-01-20 ENCOUNTER — Encounter: Payer: Self-pay | Admitting: Family Medicine

## 2017-01-20 ENCOUNTER — Ambulatory Visit: Payer: Self-pay | Admitting: Family Medicine

## 2017-01-20 ENCOUNTER — Ambulatory Visit (INDEPENDENT_AMBULATORY_CARE_PROVIDER_SITE_OTHER): Payer: BC Managed Care – PPO | Admitting: Family Medicine

## 2017-01-20 VITALS — BP 128/72 | HR 58 | Temp 99.2°F | Ht 69.0 in | Wt 182.0 lb

## 2017-01-20 DIAGNOSIS — L089 Local infection of the skin and subcutaneous tissue, unspecified: Secondary | ICD-10-CM

## 2017-01-20 MED ORDER — SULFAMETHOXAZOLE-TRIMETHOPRIM 800-160 MG PO TABS
2.0000 | ORAL_TABLET | Freq: Two times a day (BID) | ORAL | 0 refills | Status: AC
Start: 2017-01-20 — End: 2017-01-27

## 2017-01-20 NOTE — Progress Notes (Signed)
Subjective:    Patient ID: Aaron Clements, male    DOB: [birth year], 21 y.o.   MRN: [id number]  Chief Complaint  Patient presents with  . Acute Visit    HPI Patient was seen today for acute complaint: -Pt was seen in ED 8/20 for paronychia on R middle finger. -Given Bactrim DS 1 tab BID. Pt endorses taking. -Pt with continued finger pain, edema, clear drainage -Denies fever, N/V, HA, fatigue, diarrhea.  Past Medical History:  Diagnosis Date  . Headache   . Migraines     No past surgical history on file.  Family History  Problem Relation Age of Onset  . Colon cancer Maternal Grandmother   . Cancer Maternal Grandmother   . Prostate cancer Maternal Grandfather   . Cancer Maternal Grandfather     Social History   Social History  . Marital status: Single    Spouse name: N/A  . Number of children: 0  . Years of education: 15   Occupational History  . student     [employer], criminal justice  . [employer]   Social History Main Topics  . Smoking status: Never Smoker  . Smokeless tobacco: Never Used  . Alcohol use No  . Drug use: No  . Sexual activity: Not on file   Other Topics Concern  . Not on file   Social History Narrative   Lives with mother   No caffeine    Outpatient Medications Prior to Visit  Medication Sig Dispense Refill  . Acetaminophen (TYLENOL PO) Take by mouth as needed.    . sulfamethoxazole-trimethoprim (BACTRIM DS,SEPTRA DS) 800-160 MG tablet Take 1 tablet by mouth 2 (two) times daily. 14 tablet 0   No facility-administered medications prior to visit.     No Known Allergies  Review of Systems  Constitutional: Negative for chills, fever and malaise/fatigue.  HENT: Negative.   Eyes: Negative.   Respiratory: Negative.   Cardiovascular: Negative.   Gastrointestinal: Negative for nausea and vomiting.  Genitourinary: Negative.   Musculoskeletal:       R middle finger pain  Skin:       R middle finger swollen, tender.   Neurological: Negative for dizziness, weakness and headaches.  Psychiatric/Behavioral: Negative.        Objective:    Blood pressure 128/72, pulse (!) 58, temperature 99.2 F (37.3 C), temperature source Oral, height 5\' 9"  (1.753 m), weight 182 lb (82.6 kg), SpO2 98 %.   Gen. Pleasant, well-nourished, in no distress, normal affect   HEENT: [place]/AT, face symmetric, conjunctiva clear, no scleral icterus, PERRLA, EOMI, nares patent without drainage Lungs: no accessory muscle uss, CTAB, no wheezes or rales Cardiovascular: RRR, heart sounds  normal, no m/r/g Abdomen: soft and non-tender, no hepatosplenomegaly, BS normal. Musculoskeletal: No deformities, no cyanosis or clubbing Neuro:  A&Ox3, CN II-XII intact, normal gait Skin:  R middle finger lateral edge with edema and small area of induration. Dried blood noted on cuticle.  Small amount of serosanguinous fluid expressed.   Wt Readings from Last 3 Encounters:  01/20/17 182 lb (82.6 kg)  01/18/17 185 lb (83.9 kg)  01/08/17 183 lb 6.4 oz (83.2 kg)    Diabetic Foot Exam - Simple   No data filed     No results found for: WBC, HGB, HCT, PLT, GLUCOSE, CHOL, TRIG, HDL, LDLDIRECT, LDLCALC, ALT, AST, NA, K, CL, CREATININE, BUN, CO2, TSH, PSA, INR, GLUF, HGBA1C, MICROALBUR  Assessment/Plan:  Finger infection, R middle -  -  Paronychia drained in ED 8/20, finger still painful and edematous. -given Bactrim in ED, however will increase dose for MRSA skin coverage. Plan: sulfamethoxazole-trimethoprim (BACTRIM DS,SEPTRA DS) 800-160 MG tablet.  Take 2 tabs po BID x 7 days.  Discard previous rx. -Pt given RTC precautions.  Discussed keeping finger clean, dry.  Epsom salt soaks.  Covering finger while at work.  F/u in next 2 days if no improvement.

## 2017-01-20 NOTE — Patient Instructions (Addendum)
Fingertip Infection  There are two main types of fingertip infections:   Paronychia. This is an infection that happens around your nail. This type of infection can start suddenly in one nail or occur gradually over time and affect more than one nail. Long-term (chronic) paronychia can make your fingernails thick and deformed.   Felon. This is a bacterial infection in the padded tip of your finger. Felon infection can cause a painful collection of pus (abscess) to form inside your fingertip. If the infection is not treated, the infection can spread as deep as the bone.    What are the causes?  Paronychia infection can be caused by bacteria, funguses, or a mix of both. Felon infection is usually caused by the bacteria that are normally found on your skin. An infection can develop if the bacteria spread through your skin to the pad of tissue inside your fingertip.  What increases the risk?  A fingertip infection is more likely to develop in people:   Who have diabetes.   Who have a weak body defense (immune) system.   Who work with their hands.   Whose hands are exposed to moisture, chemicals, or irritants for long periods of time.   Who have poor circulation.   Who bite, chew, or pick their fingernails.    What are the signs or symptoms?  Symptoms of paronychia may affect one or more fingernails and include:   Pain, swelling, and redness around the nail.   Pus-filled pockets at the base or side of the fingernail (cuticle).   Thick fingernails that separate from the nail bed.   Pus that drains from the nail bed.    Symptoms of felon usually affect just one fingertip pad and include:   Severe, throbbing pain.   Redness.   Swelling.   Warmth.   Tenderness when the affected fingertip is touched.    How is this diagnosed?  A fingertip infection is diagnosed with a medical history and physical exam. If there is pus draining from the infection, it may be swabbed and sent to the lab for a culture. An X-ray  may be done to see if the infection has spread to the bone.  How is this treated?  Treatment for a fingertip infection may include:   Warm water or salt-water soaks several times per day.   Antibiotic medicine. This may be an ointment or pills.   Steroid ointment.   Antifungal pills.   Drainage of pus pockets. This is done by making a surgical cut (incision) to open the fingertip to drain pus.   Wearing gloves to protect your nails.    Follow these instructions at home:  Medicines   Take or apply over-the-counter and prescription medicines only as told by your health care provider.   If you were prescribed antibiotic medicine, take or apply it as told by your health care provider. Do not stop using the antibiotic even if your condition improves.  Wound care   Clean the infected area each day with warm water or salt water, or as told by your health care provider.  ? Gently wash the infected area with mild soap and water.  ? Rinse the infected area with water to remove all soap.  ? Pat the infected area dry with a clean towel. Do not rub it.  ? To make a salt-water mixture, completely dissolve -1 tsp of salt in 1 cup of warm water.   Follow instructions from your health care provider   Warmth. ? A bad smell. General instructions  Keep the dressing dry until your health care provider says it can be removed. Do not take baths, swim, use a hot tub, or do anything that would put your wound underwater until your health care provider approves.  Raise (elevate) the infected area above the level of your heart while you are sitting or lying down or as told by your health care provider.  Do not scratch or pick at the  infection.  Wear gloves as told by your health care provider, if this applies.  Keep all follow-up visits as told by your health care provider. This is important. How is this prevented?  Wear gloves when you work with your hands.  Wash your hands often with antibacterial soap.  Avoid letting your hands stay wet or irritated for long periods of time.  Do not bite your fingernails. Do not pull on your cuticles. Do not suck on your fingers.  Use clean scissors or nail clippers to trim your nails. Do not cut your fingernails very short. Contact a health care provider if:  Your pain medicine is not helping.  You have more redness, swelling, or pain at your fingertip.  You continue to have fluid, blood, or pus coming from your fingertip.  Your infection area feels warm to the touch.  You continue to notice a bad smell coming from your fingertip or your dressing. Get help right away if:  The area of redness is spreading, or you notice a red streak going away from your fingertip.  You have a fever. This information is not intended to replace advice given to you by your health care provider. Make sure you discuss any questions you have with your health care provider. Document Released: 06/25/2004 Document Revised: 10/24/2015 Document Reviewed: 11/05/2014 Elsevier Interactive Patient Education  2018 Elsevier Inc. Sulfamethoxazole; Trimethoprim, SMX-TMP tablets What is this medicine? SULFAMETHOXAZOLE; TRIMETHOPRIM or SMX-TMP (suhl fuh meth OK suh zohl; trye METH oh prim) is a combination of a sulfonamide antibiotic and a second antibiotic, trimethoprim. It is used to treat or prevent certain kinds of bacterial infections. It will not work for colds, flu, or other viral infections. This medicine may be used for other purposes; ask your health care provider or pharmacist if you have questions. COMMON BRAND NAME(S): Bacter-Aid DS, Bactrim, Bactrim DS, Septra, Septra DS What should I tell  my health care provider before I take this medicine? They need to know if you have any of these conditions: -anemia -asthma -being treated with anticonvulsants -if you frequently drink alcohol containing drinks -kidney disease -liver disease -low level of folic acid or [id number] dehydrogenase -poor nutrition or malabsorption -porphyria -severe allergies -thyroid disorder -an unusual or allergic reaction to sulfamethoxazole, trimethoprim, sulfa drugs, other medicines, foods, dyes, or preservatives -pregnant or trying to get pregnant -breast-feeding How should I use this medicine? Take this medicine by mouth with a full glass of water. Follow the directions on the prescription label. Take your medicine at regular intervals. Do not take it more often than directed. Do not skip doses or stop your medicine early. Talk to your pediatrician regarding the use of this medicine in children. Special care may be needed. This medicine has been used in children as young as 46 months of age. Overdosage: If you think you have taken too much of this medicine contact a poison control center or emergency room at once. NOTE: This medicine is only for you. Do not share this medicine with others.  What if I miss a dose? If you miss a dose, take it as soon as you can. If it is almost time for your next dose, take only that dose. Do not take double or extra doses. What may interact with this medicine? Do not take this medicine with any of the following medications: -aminobenzoate potassium -dofetilide -metronidazole This medicine may also interact with the following medications: -ACE inhibitors like benazepril, enalapril, lisinopril, and ramipril -birth control pills -cyclosporine -digoxin -diuretics -indomethacin -medicines for diabetes -methenamine -methotrexate -phenytoin -potassium supplements -pyrimethamine -sulfinpyrazone -tricyclic antidepressants -warfarin This list may not  describe all possible interactions. Give your health care provider a list of all the medicines, herbs, non-prescription drugs, or dietary supplements you use. Also tell them if you smoke, drink alcohol, or use illegal drugs. Some items may interact with your medicine. What should I watch for while using this medicine? Tell your doctor or health care professional if your symptoms do not improve. Drink several glasses of water a day to reduce the risk of kidney problems. Do not treat diarrhea with over the counter products. Contact your doctor if you have diarrhea that lasts more than 2 days or if it is severe and watery. This medicine can make you more sensitive to the sun. Keep out of the sun. If you cannot avoid being in the sun, wear protective clothing and use a sunscreen. Do not use sun lamps or tanning beds/booths. What side effects may I notice from receiving this medicine? Side effects that you should report to your doctor or health care professional as soon as possible: -allergic reactions like skin rash or hives, swelling of the face, lips, or tongue -breathing problems -fever or chills, sore throat -irregular heartbeat, chest pain -joint or muscle pain -pain or difficulty passing urine -red pinpoint spots on skin -redness, blistering, peeling or loosening of the skin, including inside the mouth -unusual bleeding or bruising -unusually weak or tired -yellowing of the eyes or skin Side effects that usually do not require medical attention (report to your doctor or health care professional if they continue or are bothersome): -diarrhea -dizziness -headache -loss of appetite -nausea, vomiting -nervousness This list may not describe all possible side effects. Call your doctor for medical advice about side effects. You may report side effects to FDA at 1-800-FDA-1088. Where should I keep my medicine? Keep out of the reach of children. Store at room temperature between 20 to 25 degrees  C (68 to 77 degrees F). Protect from light. Throw away any unused medicine after the expiration date. NOTE: This sheet is a summary. It may not cover all possible information. If you have questions about this medicine, talk to your doctor, pharmacist, or health care provider.  2018 Elsevier/Gold Standard (2012-12-23 14:38:26)

## 2017-02-03 NOTE — Progress Notes (Signed)
Subjective:   I, Aaron GristValerie Clements, am serving as a scribe for Dr. Antoine PrimasZachary Smith, DO.  Chief Complaint: Aaron Clements, DOB: 10/07/1995, is a 21 y.o. male who presents for head injury. He was injured on 01/06/17 while working at [employer]. He was in the belly of the truck when a flap came down on top of his head. He caught the flap so it did not hit him as hard as it could have. He did not lose consciousness and continued to work that evening. He did have some nausea when he got home. A couple of days later he did not have any symptoms. He was seen in the ED on 01/07/17. No CT performed. He said that he has headaches regularaly due to a concussion that he had when he was 21 years old. He also notes that he will lose focus but states that this will happen since the the head injury when he was 5.  Since then he does have some continued difficulty with concentration.  Injury date : 01/06/2017 Visit #: 1  History of Present Illness:   Patient's goals/priorities: Return to baseline   Concussion Self-Reported Symptom Score Symptoms rated on a scale 1-6, in last 24 hours  Headache: 0    Nausea: 0  Vomiting: 0  Balance Difficulty: 0  Dizziness: 0  Fatigue: 0  Trouble Falling Asleep: 2  Sleep More Than Usual: 0  Sleep Less Than Usual: 0  Daytime Drowsiness: 0  Photophobia: 0  Phonophobia: 0  Irritability: 0  Sadness: 0  Nervousness: 0  Feeling More Emotional: 0  Numbness or Tingling: 0  Feeling Slowed Down: 0  Feeling Mentally Foggy: 0  Difficulty Concentrating: 0  Difficulty Remembering: 0  Visual Problems: 0    Total Symptom Score:2   Review of Systems: Pertinent items are noted in HPI.  Review of History: Past Medical History:  Past Medical History:  Diagnosis Date  . Headache   . Migraines     Past Surgical History:  has no past surgical history on file. Family History: family history includes Cancer in his maternal grandfather and maternal grandmother; Colon cancer in his  maternal grandmother; Prostate cancer in his maternal grandfather. Social History:  reports that he has never smoked. He has never used smokeless tobacco. He reports that he does not drink alcohol or use drugs. Current Medications: has a current medication list which includes the following prescription(s): acetaminophen and azithromycin. Allergies: has No Known Allergies.  Objective:    Physical Examination Vitals:   02/04/17 0949  BP: 120/82  Pulse: (!) 48  SpO2: 98%   General appearance: alert, appears stated age and cooperative Head: Normocephalic, without obvious abnormality, atraumatic Eyes: conjunctivae/corneas clear. PERRL, EOM's intact. Fundi benign. Sclera anicteric. Lungs: clear to auscultation bilaterally and percussion Heart: regular rate and rhythm, S1, S2 normal, no murmur, click, rub or gallop Neurologic: CN 2-12 normal.  Sensation to pain, touch, and proprioception normal.  DTRs  normal in upper and lower extremities. No pathologic reflexes. Neg rhomberg, modified rhomberg, pronator drift, tandem gait, finger-to-nose; see post-concussion vestibular and oculomotor testing in chart Psychiatric: Oriented X3, intact recent and remote memory, judgement and insight, normal mood and affect  Concussion testing performed today:Patient's [place] shows the patient does have some difficulty with reaction time in cognitive efficiency shows that this test may be invalid.  Neurocognitive testing (ImPACT):   Post #1:   Verbal Memory Composite  87 (59%)   Visual Memory Composite  69 (31%)  Visual Motor Speed Composite  30.05 (4%)   Reaction Time Composite  .95 (1%)   Cognitive Efficiency Index  0.07     Vestibular Screening:       Headache  Dizziness  Smooth Pursuits n n  H. Saccades n n  V. Saccades n n  H. VOR n n  V. VOR n n  Visual Motor Sensitivity n n  Accommodation  n n  Convergence: 9 cm  n n   Additional testing included serial sevens which patient failed but did do  decent with recall   Assessment:    Aaron Clements presents with the following concussion subtypes. [x] Cognitive [] Cervical [] Vestibular [] Ocular [] Migraine [] Anxiety/Mood   Plan:   Action/Discussion: Reviewed diagnosis, management options, expected outcomes, and the reasons for scheduled and emergent follow-up. Questions were adequately answered. Patient expressed verbal understanding and agreement with the following plan.

## 2017-02-04 ENCOUNTER — Encounter: Payer: Self-pay | Admitting: Family Medicine

## 2017-02-04 ENCOUNTER — Ambulatory Visit (INDEPENDENT_AMBULATORY_CARE_PROVIDER_SITE_OTHER): Payer: BC Managed Care – PPO | Admitting: Family Medicine

## 2017-02-04 DIAGNOSIS — S060X0D Concussion without loss of consciousness, subsequent encounter: Secondary | ICD-10-CM

## 2017-02-04 NOTE — Patient Instructions (Signed)
Good to see you  Fish oil 3 grams daily  Choline  daily  You can work See me again in 2 weeks and we should retest one more time.

## 2017-02-04 NOTE — Assessment & Plan Note (Signed)
I believe the patient does have unfortunately what seems to be a very mild concussion but patient's testing on the computer seems to be somewhat invalid. 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 Patient also had some difficulty with concentration and appears with fatigue throughout the test. Even though patient is stating that he is asymptomatic throughout the day. Patient is cleared to work but I do feel that further testing in 2 weeks would be beneficial. We discussed over-the-counter medications that I think will be beneficial. Worsening symptoms to come back and we will see him sooner. I do not believe that any further imaging is necessary.

## 2017-02-18 ENCOUNTER — Ambulatory Visit: Payer: Self-pay | Admitting: Family Medicine

## 2017-02-18 NOTE — Progress Notes (Deleted)
Subjective:   @  Chief Complaint: Aaron Clements, DOB: 1995-07-29, is a 21 y.o. male who presents for No chief complaint on file.   Injury date : *** Visit #: ***  History of Present Illness:   Patient's goals/priorities: ***  CLASS CURRENT GRADE COMMENTS                               Concussion Self-Reported Symptom Score Symptoms rated on a scale 1-6, in last 24 hours  Headache: ***    Nausea: ***  Vomiting: ***  Balance Difficulty: ***   Dizziness: ***  Fatigue: ***  Trouble Falling Asleep: ***   Sleep More Than Usual: ***  Sleep Less Than Usual: ***  Daytime Drowsiness: ***  Photophobia: ***  Phonophobia: ***  Feeling anxious: ***  Confused: ***  Irritability: ***  Sadness: ***  Nervousness: ***  Feeling More Emotional: ***  Numbness or Tingling: ***  Feeling Slowed Down: ***  Feeling Mentally Foggy: ***  Difficulty Concentrating: ***  Difficulty Remembering: ***  Visual Problems: ***  Neck Pain: ***  Tinnitus: ***   Total Symptom Score: *** Previous Symptom Score: ***  Review of Systems: Pertinent items are noted in HPI.  Review of History: Past Medical History: @  Past Surgical History:  has no past surgical history on file. Family History: family history includes Cancer in his maternal grandfather and maternal grandmother; Colon cancer in his maternal grandmother; Prostate cancer in his maternal grandfather. Social History:  reports that he has never smoked. He has never used smokeless tobacco. He reports that he does not drink alcohol or use drugs. Current Medications: has a current medication list which includes the following prescription(s): acetaminophen and azithromycin. Allergies: has No Known Allergies.  Objective:    Physical Examination There were no vitals filed for this visit. General appearance: alert, appears stated age and cooperative Head: Normocephalic, without obvious abnormality,  atraumatic Eyes: conjunctivae/corneas clear. PERRL, EOM's intact. Fundi benign. Sclera anicteric. Lungs: clear to auscultation bilaterally and percussion Heart: regular rate and rhythm, S1, S2 normal, no murmur, click, rub or gallop Neurologic: CN 2-12 normal.  Sensation to pain, touch, and proprioception normal.  DTRs  normal in upper and lower extremities. No pathologic reflexes. Neg rhomberg, modified rhomberg, pronator drift, tandem gait, finger-to-nose; see post-concussion vestibular and oculomotor testing in chart Psychiatric: Oriented X3, intact recent and remote memory, judgement and insight, normal mood and affect  Concussion testing performed today:  Neurocognitive testing (ImPACT):  Baseline:*** Post #1: ***   Verbal Memory Composite *** (***%) *** (***%)   Visual Memory Composite *** (***%) *** (***%)   Visual Motor Speed Composite *** (***%) *** (***%)   Reaction Time Composite *** (***%) *** (***%)   Cognitive Efficiency Index *** ***     Vestibular Screening:   Pre VOMS  HA Score: *** Pre VOMS  Dizziness Score: ***   Headache  Dizziness  Smooth Pursuits *** ***  H. Saccades *** ***  V. Saccades *** ***  H. VOR *** ***  V. VOR *** ***  Visual Motor Sensitivity *** ***  Accommodation Right: *** cm Left: *** cm *** ***  Convergence: *** cm Divergence: *** cm *** ***   Balance Screen: ***  Additional testing performed today: { :28529}   Assessment:    No diagnosis found.  Aaron Clements presents with the following concussion subtypes. Cognitive Cervical Vestibular Ocular Migraine Anxiety/Mood  Plan:   Action/Discussion: Reviewed diagnosis, management options, expected outcomes, and the reasons for scheduled and emergent follow-up. Questions were adequately answered. Patient expressed verbal understanding and agreement with the following plan.      Participation in school/work: Patient is cleared to return to work/school and  activities of daily living without restrictions.  Patient is not cleared to return to work/school until further notice.  Patient may return to work/school on ***, with the following restrictions/supports:  Length of Day:  Please allow patient to use *** class as study hall in a quiet area.  Shortened school/work day: Recommend *** until ***.  Recommend core classes only.  Extra Time:  Take mental rest breaks during the day as needed. Check for return of symptoms when participating in any activities that require a significant amount of attention or concentration.  Allow extra time to complete tasks.  Please allow *** weeks to make up missed assignments, test, quizzes.  Visual/Vestibular Accommodations in School:  Allow patient to eat lunch in quiet environment with 1-2 classmates.  Allow patient to leave class 5 minutes before end of period to avoid busy/noisy hallway.  Please provide any supplemental learning materials (power points, lecture notes, handouts, etc) in minimum size 18 font and allow/provide any auditory supplements to learning when possible (books on tape, audio tape lectures, etc) to limit visual stress in the classroom.  Patient is cleared for auditory participation only. Patient is not cleared for homework, quizzes, or tests at this time.   Testing:  May begin taking tests/quizzes on *** with no more than one test/quiz per day.   No significant classroom or standardized testing until ***.  Home/Extracurricular:  Lessen work/homework load to allow adequate cognitive rest. Work *** minutes with intervals of *** minute breaks (total *** hours).  Limit visual stimulants including: driving, watching television/movies, reading, using cell phone, etc. - to ensure relative visual cognitive rest. NOT cleared for video or phone games. May participate *** minutes with intervals of *** minute breaks (total *** hours).   Participation in physical activity: Patient is  cleared to return to physical activity participation without restrictions.  Patient is not cleared for formal physical activity (includes physical education class, sports practices, sports games, weight training, etc) at this time.   However, we recommend that patient has 20-30 minutes of light cardiovascular activity daily, with NO risk of head injury (example: walking), staying below level of symptoms.  Recommend gradual progression to *** vestibular exercise (30-45 min/day) with no risk of head trauma while staying below level of symptoms.  See your exercise treatment menu for details.   Begin your exercise prescription on ____________ (see separate exercise prescription form)  Cleared for physical activity that poses NO RISK of head trauma.   Gradual return to physical activity under the supervision of a physician and/or athletic trainer  Once asymptomatic for 24 hours, patient may start Stage *** of CFPSM's { :10024} Exercise Progression Protocol. This is to be monitored by patient's { :28383}    Patient may start Stage *** of CFPSM's { :10024} Exercise Progression Protocol. This is to be monitored by patient's { :28383}.   Patient is not cleared for full contact activities, activities with risk of head trauma or unsupervised physical activity while participating in CFPSM's Exercise Progression. Check for return of symptoms (using Concussion Education Form Symptom List) when participating in activity and 24 hours following. If symptoms return, patient to contact our office for further recommendations.    [facility] Medical Recommendations  form has been given to patient allowing *** to progress patient back into full participation.  Active Treatment Strategies:  Fueling your brain is important for recovery. It is essential to stay well hydrated, aiming for half of your body weight in fluid ounces per day (100 lbs = 50 oz). We also recommend eating breakfast to start your day and focus on a  well-balanced diet containing lean protein, 'good' fats, and complex carbohydrates. See your nutrition / hydration handout for more details.   Quality sleep is vital in your concussion recovery. We encourage lots of sleep for the first 24-72 hours after injury but following this period it is important to regulate your sleep cycle. We encourage *** hours of quality sleep per night. See your sleep handout for more details and strategies to quality sleep.  IF NOT USING THE OPTIONS BELOW DELETE THEM  Treating your vestibular and visual dysfunction will decrease your recovery time and improve your symptoms. Begin your home vestibular exercise program as directed on your AVS.    Begin taking Amantadine medicine as directed.   Begin taking DHA supplement as directed.    Begin home exercise program for neck as directed.   Follow-up information:  Follow up appointment at [facility] in *** .   Call [facility] at [phone] at least 24 hours after completion of Stage 4 with status update.  Patient needs to arrive 30 minutes prior to appointment to complete the following tests: { :28378}.    Patient Education:  Reviewed with patient the risks (i.e, a repeat concussion, post-concussion syndrome, second-impact syndrome) of returning to play prior to complete resolution, and thoroughly reviewed the signs and symptoms of concussion.Reviewed need for complete resolution of all symptoms, with rest AND exertion, prior to return to play.  Reviewed red flags for urgent medical evaluation: worsening symptoms, nausea/vomiting, intractable headache, musculoskeletal changes, focal neurological deficits.  [facility]'s Concussion Care Plan, which clearly outlines the plans stated above, was given to patient.  I was personally involved with the physical evaluation of and am in agreement with the assessment and treatment plan for this patient.  Greater than 50% of this  encounter was spent in direct consultation with the patient in evaluation, counseling, and coordination of care. Duration of encounter: { :28531} minutes.  After Visit Summary printed out and provided to patient as appropriate.  This note is written by Judi Saa, in the presence of and acting as the scribe of Judi Saa, DO.

## 2017-03-04 ENCOUNTER — Emergency Department (HOSPITAL_BASED_OUTPATIENT_CLINIC_OR_DEPARTMENT_OTHER)
Admission: EM | Admit: 2017-03-04 | Discharge: 2017-03-04 | Disposition: A | Discharge status: Home / Self Care | Payer: Worker's Compensation | Attending: Physician Assistant | Admitting: Physician Assistant

## 2017-03-04 ENCOUNTER — Encounter (HOSPITAL_BASED_OUTPATIENT_CLINIC_OR_DEPARTMENT_OTHER): Payer: Self-pay | Admitting: Emergency Medicine

## 2017-03-04 DIAGNOSIS — S8991XA Unspecified injury of right lower leg, initial encounter: Secondary | ICD-10-CM | POA: Diagnosis present

## 2017-03-04 DIAGNOSIS — Y990 Civilian activity done for income or pay: Secondary | ICD-10-CM | POA: Insufficient documentation

## 2017-03-04 DIAGNOSIS — Y9389 Activity, other specified: Secondary | ICD-10-CM | POA: Insufficient documentation

## 2017-03-04 DIAGNOSIS — Y9289 Other specified places as the place of occurrence of the external cause: Secondary | ICD-10-CM | POA: Diagnosis not present

## 2017-03-04 DIAGNOSIS — S81011A Laceration without foreign body, right knee, initial encounter: Secondary | ICD-10-CM | POA: Diagnosis not present

## 2017-03-04 DIAGNOSIS — W268XXA Contact with other sharp object(s), not elsewhere classified, initial encounter: Secondary | ICD-10-CM | POA: Insufficient documentation

## 2017-03-04 MED ORDER — LIDOCAINE-EPINEPHRINE (PF) 2 %-1:200000 IJ SOLN
10.0000 mL | Freq: Once | INTRAMUSCULAR | Status: AC
  Administered 2017-03-04: 10 mL
  Filled 2017-03-04: qty 10

## 2017-03-04 NOTE — ED Provider Notes (Signed)
[facility] Provider Note   CSN: [id number] Arrival date & time: 03/04/17  1954     History   Chief Complaint Chief Complaint  Patient presents with  . Leg Injury    HPI Aaron Clements is a 21 y.o. male who presents emergent department today for laceration to the right knee that occurred at approximately 7:40 PM. The patient was at work one of the boxes that he picked up at the bottom fall through and a beer can sliced his right knee. Appears to be roughly 2cm and superficial. Bleeding is well controlled. The patient denies any arthralgias, numbness or tingling. Tetanus is up to date.   HPI  Past Medical History:  Diagnosis Date  . Headache   . Migraines     Patient Active Problem List   Diagnosis Date Noted  . Mild concussion 01/08/2017  . Unspecified constipation 12/19/2012    History reviewed. No pertinent surgical history.     Home Medications    Prior to Admission medications   Medication Sig Start Date End Date Taking? Authorizing Provider  Acetaminophen (TYLENOL PO) Take by mouth as needed.    [provider]  azithromycin (ZITHROMAX) 250 MG tablet Take by mouth daily.    [provider]    Family History Family History  Problem Relation Age of Onset  . Colon cancer Maternal Grandmother   . Cancer Maternal Grandmother   . Prostate cancer Maternal Grandfather   . Cancer Maternal Grandfather     Social History Social History  Substance Use Topics  . Smoking status: Never Smoker  . Smokeless tobacco: Never Used  . Alcohol use No     Allergies   Patient has no known allergies.   Review of Systems Review of Systems  Musculoskeletal: Negative for arthralgias.  Skin: Positive for wound (Right knee).  Allergic/Immunologic: Negative for immunocompromised state.  Neurological: Negative for weakness and numbness.     Physical Exam Updated Vital Signs BP (!) 128/51 (BP Location: Right Arm)   Pulse (!) 56    Temp 98.6 F (37 C) (Oral)   Resp 18   Ht  (1.753 m)   Wt 81.6 kg (180 lb)   SpO2 97%   BMI 26.58 kg/m   Physical Exam  Constitutional: He appears well-developed and well-nourished.  HENT:  Head: Normocephalic and atraumatic.  Right Ear: External ear normal.  Left Ear: External ear normal.  Eyes: Conjunctivae are normal. Right eye exhibits no discharge. Left eye exhibits no discharge. No scleral icterus.  Cardiovascular:  Pulses:      Dorsalis pedis pulses are 2+ on the right side, and 2+ on the left side.       Posterior tibial pulses are 2+ on the right side, and 2+ on the left side.  Pulmonary/Chest: Effort normal. No respiratory distress.  Musculoskeletal:       Right knee: He exhibits laceration. He exhibits normal range of motion and no swelling. No medial joint line and no lateral joint line tenderness noted.  Neurological: He is alert.  Skin: Skin is warm and dry. Capillary refill takes less than 2 seconds. Laceration noted. No pallor.  2cm laceration distal to patella. Bleeding controlled.   Psychiatric: He has a normal mood and affect.  Nursing note and vitals reviewed.    ED Treatments / Results  Labs (all labs ordered are listed, but only abnormal results are displayed) Labs Reviewed - No data to display  EKG  EKG Interpretation  None       Radiology No results found.  Procedures .Marland KitchenLaceration Repair Date/Time: 03/04/2017 10:25 PM Performed by: Jacinto Halim Authorized by: Jacinto Halim   Consent:    Consent obtained:  Verbal   Consent given by:  Patient   Risks discussed:  Infection, pain, poor cosmetic result, need for additional repair, nerve damage, poor wound healing and retained foreign body   Alternatives discussed:  No treatment Anesthesia (see MAR for exact dosages):    Anesthesia method:  Local infiltration   Local anesthetic:  Lidocaine 2% WITH epi Laceration details:    Location:  Leg   Leg location:  R knee   Length  (cm):  2 Repair type:    Repair type:  Simple Pre-procedure details:    Preparation:  Patient was prepped and draped in usual sterile fashion Treatment:    Area cleansed with:  Saline and soap and water   Amount of cleaning:  Standard   Irrigation solution:  Sterile water   Irrigation volume:    Irrigation method:  Pressure wash   Visualized foreign bodies/material removed: no   Skin repair:    Repair method:  Sutures   Suture size:  4-0   Suture material:  Prolene   Suture technique:  Simple interrupted   Number of sutures:  3 Approximation:    Approximation:  Close Post-procedure details:    Dressing:  Non-adherent dressing   Patient tolerance of procedure:  Tolerated well, no immediate complications   (including critical care time)  Medications Ordered in ED Medications  lidocaine-EPINEPHrine (XYLOCAINE W/EPI) 2 %-1:200000 (PF) injection 10 mL (10 mLs Infiltration Given 03/04/17 2123)     Initial Impression / Assessment and Plan / ED Course  I have reviewed the triage vital signs and the nursing notes.  Pertinent labs & imaging results that were available during my care of the patient were reviewed by me and considered in my medical decision making (see chart for details).     21 year old male with laceration distal to right patella. Pressure irrigation performed. Wound explored and base of wound visualized in a bloodless field without evidence of foreign body.  Laceration occurred < 8 hours prior to repair which was well tolerated. Tdap up to date.  Pt has no comorbidities to effect normal wound healing. Pt discharged without antibiotics.  Discussed suture home care with patient and answered questions. Pt to follow-up for wound check and suture removal in 7 days; they are to return to the ED sooner for signs of infection. Pt is hemodynamically stable with no complaints prior to dc.   Final Clinical Impressions(s) / ED Diagnoses   Final diagnoses:  Laceration of  right knee, initial encounter    New Prescriptions New Prescriptions   No medications on file     Princella Pellegrini 03/04/17 2227    Abelino Derrick, MD 03/06/17 1530

## 2017-03-04 NOTE — ED Triage Notes (Signed)
Patient states that he was at work and had a box fall and hit his right knee - he has laceration to this knee

## 2017-03-04 NOTE — Discharge Instructions (Signed)
Please read and follow all provided instructions.  Your diagnoses today is a laceration. A laceration is a cut or lesion that goes through all layers of the skin and into the tissue just beneath the skin. This was repaired with 3 stitches or a tissue adhesive similar to a super glue.  Follow up with your doctor, an urgent care, or this Emergency Department for removal of your stitches in 7-10 days. Keep the wound clean and dry for the next 24 hours and leave the dressing in place. You may shower after 24 hours. Do not soak the area for long periods of times as in a bath until the sutures are removed. After 24 hours you may remove the dressing and gently clean the laceration site with antibacterial soap (i.e. Neosporin or Bacitracin) and warm water 2 times a day. Pat dry with clean towel. Do not scrub. Once the wound has healed, scarring can be minimized by covering the wound with sunscreen during the day for 1 full year.  Return instructions:  You have redness, swelling, or increasing pain in the wound.  You see a red line that goes away from the wound.  You have yellowish-white fluid (pus) coming from the wound.  You have a fever (above 100.40F) You notice a bad smell coming from the wound or dressing.  Your wound breaks open before or after sutures have been removed.  You notice something coming out of the wound such as wood or glass.  Your wound is on your hand or foot and you cannot move a finger or toe.  Your pain is not controlled with prescribed medicine.     Additional Information:  If you did not receive a tetanus shot today because you thought you were up to date, but did not recall when your last one was given, make sure to check with your primary caregiver to determine if you need one.   Your vital signs today were: BP (!) 128/51 (BP Location: Right Arm)    Pulse (!) 56    Temp 98.6 F (37 C) (Oral)    Resp 18    Ht  (1.753 m)    Wt 81.6 kg (180 lb)    SpO2 97%    BMI 26.58  kg/m  If your blood pressure (BP) was elevated above 135/85 this visit, please have this repeated by your doctor within one month.

## 2017-03-04 NOTE — ED Notes (Signed)
ED Provider at bedside for lac repair 

## 2017-04-14 ENCOUNTER — Telehealth: Payer: Self-pay | Admitting: Emergency Medicine

## 2017-04-14 NOTE — Telephone Encounter (Signed)
Spoke with patient regarding forms that he would like PCP to sign. Patient said he was willing to come and be seen so Dr. Salomon FickBanks could finish form. Patient has been scheduled for next week to see PCP Copied from CRM [phone]. Topic: General - Other >> Apr 14, 2017  2:00 PM Percival SpanishKennedy, Cheryl W wrote: Reason for CRM:  pt brought a paper in and now the [employer] does not have the paper but pt said he turned it in. They sent another paper to be filled out and he is asking if he will be charged to have the paper filled out. Would like a call back   [phone]  >> Apr 14, 2017  2:20 PM Cox, Armando GangSheena H, CMA wrote: I can't find anything in his chart about any paperwork... Please check and see if you have it.

## 2017-04-20 ENCOUNTER — Ambulatory Visit: Payer: Self-pay | Admitting: Family Medicine

## 2017-04-26 ENCOUNTER — Ambulatory Visit (INDEPENDENT_AMBULATORY_CARE_PROVIDER_SITE_OTHER): Payer: BC Managed Care – PPO | Admitting: Family Medicine

## 2017-04-26 ENCOUNTER — Encounter: Payer: Self-pay | Admitting: Family Medicine

## 2017-04-26 VITALS — BP 116/74 | HR 42 | Wt 184.1 lb

## 2017-04-26 DIAGNOSIS — Z029 Encounter for administrative examinations, unspecified: Secondary | ICD-10-CM | POA: Diagnosis not present

## 2017-04-26 NOTE — Progress Notes (Signed)
Subjective:    Patient ID: Aaron Clements, male    DOB: [birth year], 21 y.o.   MRN: [id number]  Chief Complaint  Patient presents with  . Other    Form completion    HPI Patient was seen today for paperwork.  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  Pt has already done the physical test.  Pt is hoping to participate in the class starting in March.  Pt mentions after last OFV, he quit his job at [employer].  He started working for a [employer] company, where a broken glass cut his R knee. Pt was seen by an UC physician approved by the company.  At that time pt's knee was sutured.  Pt states ~ 2 wks later he was terminated by his employer for "excessive breaks".  Pt is still having pain and issues with his knee.  Pt tried to make an appt with the UC provider that saw him previously, but was told he needed to contact the company.  Per pt, the company stated they were no longer responsible.    Past Medical History:  Diagnosis Date  . Headache   . Migraines     No Known Allergies  ROS General: Denies fever, chills, night sweats, changes in weight, changes in appetite HEENT: Denies headaches, ear pain, changes in vision, rhinorrhea, sore throat CV: Denies CP, palpitations, SOB, orthopnea Pulm: Denies SOB, cough, wheezing GI: Denies abdominal pain, nausea, vomiting, diarrhea, constipation GU: Denies dysuria, hematuria, frequency, vaginal discharge Msk: Denies muscle cramps, joint pains +R knee pain Neuro: Denies weakness, numbness, tingling Skin: Denies rashes, bruising Psych: Denies depression, anxiety, hallucinations     Objective:    Blood pressure 116/74, pulse (!) 42, weight 184 lb 1.6 oz (83.5 kg), SpO2 94 %.   Gen. Pleasant, well-nourished, in no distress, normal affect   HEENT: [place]/AT, face symmetric, conjunctiva clear, no scleral icterus, PERRLA, EOMI, nares patent without drainage, pharynx without erythema or exudate. Lungs: no accessory muscle use, CTAB, no wheezes or  rales Cardiovascular: RRR, no m/r/g, no peripheral edema Abdomen: BS present, soft, NT/ND Musculoskeletal: No deformities, no cyanosis or clubbing, normal tone Neuro:  A&Ox3, CN II-XII intact, normal gait Skin:  Warm, no lesions/ rash   Wt Readings from Last 3 Encounters:  04/26/17 184 lb 1.6 oz (83.5 kg)  03/04/17 180 lb (81.6 kg)  02/04/17 182 lb (82.6 kg)    No results found for: WBC, HGB, HCT, PLT, GLUCOSE, CHOL, TRIG, HDL, LDLDIRECT, LDLCALC, ALT, AST, NA, K, CL, CREATININE, BUN, CO2, TSH, PSA, INR, GLUF, HGBA1C, MICROALBUR  Assessment/Plan:  Administrative encounter  -paper work signed for police academy -pt advised to return to UC previously seen by for R knee.  Pt advised if unable to be seen there, ok to RTC for eval.    -Discussed knee problems may prevent pt from participating in police academy's upcoming class.  RTC prn.

## 2017-06-11 ENCOUNTER — Telehealth: Payer: Self-pay | Admitting: Family Medicine

## 2017-06-11 NOTE — Telephone Encounter (Signed)
CRM for notification. See Telephone encounter for:   06/11/17.  Relation to pt: self  Call back number: [phone]    Reason for call:   Patient requesting Dr. Phoebe SharpsNote for the [employer] clearing him for duty, note reflecting no restrictions and patient has a clean bill of health, please advise

## 2017-06-11 NOTE — Telephone Encounter (Signed)
Please advise if okay to create note for patient. Thank you.

## 2017-06-11 NOTE — Telephone Encounter (Signed)
Please inquire if pt had his knee injury re-evaluated.  Per last OFV, he was supposed to f/u with the UC that initially evaluated as it was related to a workers comp claim.  If pt can provide documentation that the aforementioned knee injury has been addressed and is resolved then I am ok with the note.

## 2017-06-14 NOTE — Telephone Encounter (Signed)
Patient brought by records pertaining to his knee-- will give to Torrie.

## 2017-06-14 NOTE — Telephone Encounter (Signed)
Paperwork received and placed on Dr. Salomon FickBanks desk for review.

## 2017-06-23 ENCOUNTER — Encounter: Payer: Self-pay | Admitting: Emergency Medicine

## 2017-06-24 NOTE — Telephone Encounter (Signed)
Left a VM for patient regarding letter being ready for pick up at the front desk.

## 2017-07-05 ENCOUNTER — Ambulatory Visit
Admission: RE | Admit: 2017-07-05 | Discharge: 2017-07-05 | Disposition: A | Discharge status: Home / Self Care | Payer: No Typology Code available for payment source | Source: Ambulatory Visit | Attending: Occupational Medicine | Admitting: Occupational Medicine

## 2017-07-05 ENCOUNTER — Other Ambulatory Visit: Payer: Self-pay | Admitting: Occupational Medicine

## 2017-07-05 ENCOUNTER — Telehealth: Payer: Self-pay | Admitting: *Deleted

## 2017-07-05 DIAGNOSIS — Z021 Encounter for pre-employment examination: Secondary | ICD-10-CM

## 2017-07-05 NOTE — Telephone Encounter (Signed)
Patient called in requesting orders for "x-ray of my eyes for vision testing" be sent to [facility] in [employer]. I called [facility] and spoke w/ Maralyn SagoSarah. She informed me that the only testing they usually do for police academy entrants is a CXR, and that this is normally ordered by the academy physician. Returned call to patient and informed him of this and he stated that he does have a signed order for CXR from a PA that is affiliated with the academy. I informed him that this should be sufficient for testing. No further needs identified at this time.

## 2017-07-06 NOTE — Telephone Encounter (Signed)
Patient has picked up paperwork.

## 2018-09-10 ENCOUNTER — Encounter (HOSPITAL_COMMUNITY): Payer: Self-pay | Admitting: Emergency Medicine

## 2018-09-10 ENCOUNTER — Other Ambulatory Visit: Payer: Self-pay

## 2018-09-10 ENCOUNTER — Emergency Department (HOSPITAL_COMMUNITY)
Admission: EM | Admit: 2018-09-10 | Discharge: 2018-09-10 | Disposition: A | Discharge status: Home / Self Care | Payer: 59 | Attending: Emergency Medicine | Admitting: Emergency Medicine

## 2018-09-10 DIAGNOSIS — Z23 Encounter for immunization: Secondary | ICD-10-CM | POA: Diagnosis not present

## 2018-09-10 DIAGNOSIS — S61215A Laceration without foreign body of left ring finger without damage to nail, initial encounter: Secondary | ICD-10-CM | POA: Diagnosis not present

## 2018-09-10 DIAGNOSIS — S6992XA Unspecified injury of left wrist, hand and finger(s), initial encounter: Secondary | ICD-10-CM | POA: Diagnosis present

## 2018-09-10 DIAGNOSIS — Y939 Activity, unspecified: Secondary | ICD-10-CM | POA: Insufficient documentation

## 2018-09-10 DIAGNOSIS — W260XXA Contact with knife, initial encounter: Secondary | ICD-10-CM | POA: Diagnosis not present

## 2018-09-10 DIAGNOSIS — Y929 Unspecified place or not applicable: Secondary | ICD-10-CM | POA: Insufficient documentation

## 2018-09-10 DIAGNOSIS — Y999 Unspecified external cause status: Secondary | ICD-10-CM | POA: Diagnosis not present

## 2018-09-10 MED ORDER — LIDOCAINE HCL (PF) 1 % IJ SOLN
5.0000 mL | Freq: Once | INTRAMUSCULAR | Status: AC
  Administered 2018-09-10: 22:00:00 5 mL
  Filled 2018-09-10: qty 30

## 2018-09-10 MED ORDER — CEPHALEXIN 500 MG PO CAPS
500.0000 mg | ORAL_CAPSULE | Freq: Once | ORAL | Status: AC
  Administered 2018-09-10: 500 mg via ORAL
  Filled 2018-09-10: qty 1

## 2018-09-10 MED ORDER — CEPHALEXIN 500 MG PO CAPS: 500.0000 mg | ORAL_CAPSULE | Freq: Three times a day (TID) | ORAL | 0 refills | Status: DC

## 2018-09-10 MED ORDER — TETANUS-DIPHTH-ACELL PERTUSSIS 5-2.5-18.5 LF-MCG/0.5 IM SUSP
0.5000 mL | Freq: Once | INTRAMUSCULAR | Status: AC
  Administered 2018-09-10: 0.5 mL via INTRAMUSCULAR
  Filled 2018-09-10: qty 0.5

## 2018-09-10 NOTE — Discharge Instructions (Signed)
Please read and follow all provided instructions.  Your diagnoses today include:  1. Laceration of left ring finger without foreign body without damage to nail, initial encounter     Tests performed today include:  Vital signs. See below for your results today.   Medications prescribed:   Keflex (cephalexin) - antibiotic  You have been prescribed an antibiotic medicine: take the entire course of medicine even if you are feeling better. Stopping early can cause the antibiotic not to work.  Take any prescribed medications only as directed.   Home care instructions:  Follow any educational materials and wound care instructions contained in this packet.   Keep affected area above the level of your heart when possible to minimize swelling. Wash area gently twice a day with warm soapy water. Do not apply alcohol or hydrogen peroxide. Cover the area if it draining or weeping.   Follow-up instructions: Suture Removal: Return to the Emergency Department or see your primary care care doctor in 10-14 days for a recheck of your wound and removal of your sutures or staples.    Return instructions:  Return to the Emergency Department if you have:  Fever  Worsening pain  Worsening swelling of the wound  Pus draining from the wound  Redness of the skin that moves away from the wound, especially if it streaks away from the affected area   Any other emergent concerns  Your vital signs today were: BP (!) 149/84 (BP Location: Left Arm)    Pulse 65    Temp 98 F (36.7 C) (Oral)    Resp 18    Ht 5\' 10"  (1.778 m)    Wt 86.2 kg    SpO2 99%    BMI 27.26 kg/m  If your blood pressure (BP) was elevated above 135/85 this visit, please have this repeated by your doctor within one month. --------------

## 2018-09-10 NOTE — ED Triage Notes (Signed)
Patient here from home with complaints of left ring finger laceration on pocket knife. Bleeding controlled, denies blood thinners.

## 2018-09-10 NOTE — ED Provider Notes (Signed)
[facility]-EMERGENCY DEPT Provider Note   CSN: [id number] Arrival date & time: 09/10/18  2120    History   Chief Complaint Chief Complaint  Patient presents with  . Laceration    HPI Aaron Clements is a 23 y.o. male.     Patient presents to the emergency with complaint of laceration to the left ring finger on a pocket knife just prior to arrival.  No treatments prior to arrival.  Bleeding controlled with pressure.  He is able to flex and extend the finger.  Unknown last tetanus.  Onset of symptoms acute.  Course is constant.     Past Medical History:  Diagnosis Date  . Headache   . Migraines     Patient Active Problem List   Diagnosis Date Noted  . Mild concussion 01/08/2017  . Unspecified constipation 12/19/2012    History reviewed. No pertinent surgical history.      Home Medications    Prior to Admission medications   Medication Sig Start Date End Date Taking? Authorizing Provider  Acetaminophen (TYLENOL PO) Take by mouth as needed.    [provider]    Family History Family History  Problem Relation Age of Onset  . Colon cancer Maternal Grandmother   . Cancer Maternal Grandmother   . Prostate cancer Maternal Grandfather   . Cancer Maternal Grandfather     Social History Social History   Tobacco Use  . Smoking status: Never Smoker  . Smokeless tobacco: Never Used  Substance Use Topics  . Alcohol use: No  . Drug use: No     Allergies   Patient has no known allergies.   Review of Systems Review of Systems  Constitutional: Negative for activity change.  Musculoskeletal: Positive for myalgias. Negative for arthralgias, back pain, gait problem, joint swelling and neck pain.  Skin: Positive for wound.  Neurological: Negative for weakness and numbness.     Physical Exam Updated Vital Signs BP (!) 149/84 (BP Location: Left Arm)   Pulse 65   Temp 98 F (36.7 C) (Oral)   Resp 18   SpO2 99%    Physical Exam Vitals signs and nursing note reviewed.  Constitutional:      Appearance: He is well-developed.  HENT:     Head: Normocephalic and atraumatic.  Eyes:     Conjunctiva/sclera: Conjunctivae normal.  Neck:     Musculoskeletal: Normal range of motion and neck supple.  Pulmonary:     Effort: No respiratory distress.  Musculoskeletal:     Comments: 1 cm linear defect noted on the ulnar aspect of the volar left ring finger just distal to the base of the finger.  Patient with full range of motion in flexion and extension with normal strength with flexion and extension against active resistance.  Normal distal sensation in finger and fingertip prior to administration of local anesthesia.   Defect in the lateral finger noted after finger tourniquet placed.  Base of the finger was explored as best as possible through the limited opening and I did not visualize any tendon defect.  No foreign body seen or palpated.  Skin:    General: Skin is warm and dry.  Neurological:     Mental Status: He is alert.      ED Treatments / Results  Labs (all labs ordered are listed, but only abnormal results are displayed) Labs Reviewed - No data to display  EKG None  Radiology No results found.  Procedures .Marland KitchenLaceration Repair Date/Time:  09/10/2018 10:38 PM Performed by: Renne Crigler, PA-C Authorized by: Renne Crigler, PA-C   Consent:    Consent obtained:  Verbal   Consent given by:  Patient   Risks discussed:  Infection, pain, tendon damage and need for additional repair   Alternatives discussed:  No treatment Anesthesia (see MAR for exact dosages):    Anesthesia method:  Local infiltration   Local anesthetic:  Lidocaine 1% w/o epi Laceration details:    Location:  Finger   Finger location:  L ring finger   Length (cm):  1 Pre-procedure details:    Preparation:  Patient was prepped and draped in usual sterile fashion Exploration:    Hemostasis achieved with:  Tourniquet and  direct pressure   Wound exploration: wound explored through full range of motion and entire depth of wound probed and visualized     Wound extent: no tendon damage noted     Contaminated: no   Treatment:    Area cleansed with:  Hibiclens Skin repair:    Repair method:  Sutures   Suture size:  5-0   Suture material:  Nylon   Suture technique:  Simple interrupted   Number of sutures:  4 Approximation:    Approximation:  Close Post-procedure details:    Dressing:  Open (no dressing)   Patient tolerance of procedure:  Tolerated well, no immediate complications   (including critical care time)  Medications Ordered in ED Medications  cephALEXin (KEFLEX) capsule 500 mg (has no administration in time range)  Tdap (BOOSTRIX) injection 0.5 mL (0.5 mLs Intramuscular Given 09/10/18 2202)  lidocaine (PF) (XYLOCAINE) 1 % injection 5 mL (5 mLs Infiltration Given 09/10/18 2201)     Initial Impression / Assessment and Plan / ED Course  I have reviewed the triage vital signs and the nursing notes.  Pertinent labs & imaging results that were available during my care of the patient were reviewed by me and considered in my medical decision making (see chart for details).        Patient seen and examined. Work-up initiated. Medications ordered.  Discussed wound repair and cleaning procedure with patient and he agrees to proceed.  Wound will need to be explored to rule out underlying injury to tendon.  Vital signs reviewed and are as follows: BP (!) 149/84 (BP Location: Left Arm)   Pulse 65   Temp 98 F (36.7 C) (Oral)   Resp 18   Ht 5\' 10"  (1.778 m)   Wt 86.2 kg   SpO2 99%   BMI 27.26 kg/m   10:40 PM Patient counseled on wound care.  In addition, also discussed that no obvious tendon defect noted.  Clinically he has preserved strength.  Normal sensation and no clinical signs of nerve compromise.  Patient counseled on need to return or see PCP/urgent care for suture removal in 10-14 days.   If he has any sense of weakness in the finger especially in extension, he should address this with his primary care doctor at time of suture removal.  Pocket knife was supposedly clean, however given depth of defect will place on a 5-day course of Keflex as prophylaxis.  Finger splint provided.  Discussed no strenuous grasping activity with his hand.  Tetanus has been updated.  Patient was urged to return to the Emergency Department urgently with worsening pain, swelling, expanding erythema especially if it streaks away from the affected area, fever, or if they have any other concerns.  In addition, patient verbalized understanding.  Final Clinical Impressions(s) / ED Diagnoses   Final diagnoses:  Laceration of left ring finger without foreign body without damage to nail, initial encounter   Finger laceration, clinically and objectively without any signs of tendon or nerve injury, however with limitations as noted above.  No indication for immediate orthopedic hand referral or involvement.  Patient to follow-up with PCP for suture removal and wound recheck.  No concern for foreign body given mechanism and exam.  ED Discharge Orders         Ordered    cephALEXin (KEFLEX) 500 MG capsule  3 times daily     09/10/18 2235           Renne CriglerGeiple, Rishith Siddoway, PA-C 09/10/18 2245    Benjiman CorePickering, Nathan, MD 09/10/18 2337

## 2018-09-10 NOTE — ED Notes (Signed)
Cleaned patient hand and applied gauze over stitches.

## 2018-12-28 ENCOUNTER — Encounter: Payer: Self-pay | Admitting: Family Medicine

## 2018-12-28 ENCOUNTER — Ambulatory Visit (INDEPENDENT_AMBULATORY_CARE_PROVIDER_SITE_OTHER): Payer: 59 | Admitting: Family Medicine

## 2018-12-28 ENCOUNTER — Other Ambulatory Visit: Payer: Self-pay

## 2018-12-28 VITALS — BP 118/60 | HR 60 | Temp 97.7°F | Ht 70.0 in | Wt 195.6 lb

## 2018-12-28 DIAGNOSIS — R8290 Unspecified abnormal findings in urine: Secondary | ICD-10-CM

## 2018-12-28 DIAGNOSIS — Z13220 Encounter for screening for lipoid disorders: Secondary | ICD-10-CM | POA: Diagnosis not present

## 2018-12-28 DIAGNOSIS — Z0000 Encounter for general adult medical examination without abnormal findings: Secondary | ICD-10-CM

## 2018-12-28 DIAGNOSIS — Z131 Encounter for screening for diabetes mellitus: Secondary | ICD-10-CM

## 2018-12-28 DIAGNOSIS — Z113 Encounter for screening for infections with a predominantly sexual mode of transmission: Secondary | ICD-10-CM | POA: Diagnosis not present

## 2018-12-28 LAB — POCT URINALYSIS DIPSTICK
Bilirubin, UA: NEGATIVE
Blood, UA: NEGATIVE
Glucose, UA: NEGATIVE
Ketones, UA: NEGATIVE
Leukocytes, UA: NEGATIVE
Nitrite, UA: NEGATIVE
Protein, UA: NEGATIVE
Spec Grav, UA: 1.02 (ref 1.010–1.025)
Urobilinogen, UA: 0.2 E.U./dL
pH, UA: 6 (ref 5.0–8.0)

## 2018-12-28 NOTE — Patient Instructions (Signed)
Preventive Care 19-25 Years Old, Male Preventive care refers to lifestyle choices and visits with your health care provider that can promote health and wellness. This includes:  A yearly physical exam. This is also called an annual well check.  Regular dental and eye exams.  Immunizations.  Screening for certain conditions.  Healthy lifestyle choices, such as eating a healthy diet, getting regular exercise, not using drugs or products that contain nicotine and tobacco, and limiting alcohol use. What can I expect for my preventive care visit? Physical exam Your health care provider will check:  Height and weight. These may be used to calculate body mass index (BMI), which is a measurement that tells if you are at a healthy weight.  Heart rate and blood pressure.  Your skin for abnormal spots. Counseling Your health care provider may ask you questions about:  Alcohol, tobacco, and drug use.  Emotional well-being.  Home and relationship well-being.  Sexual activity.  Eating habits.  Work and work Statistician. What immunizations do I need?  Influenza (flu) vaccine  This is recommended every year. Tetanus, diphtheria, and pertussis (Tdap) vaccine  You may need a Td booster every 10 years. Varicella (chickenpox) vaccine  You may need this vaccine if you have not already been vaccinated. Human papillomavirus (HPV) vaccine  If recommended by your health care provider, you may need three doses over 6 months. Measles, mumps, and rubella (MMR) vaccine  You may need at least one dose of MMR. You may also need a second dose. Meningococcal conjugate (MenACWY) vaccine  One dose is recommended if you are 45-76 years old and a Market researcher living in a residence hall, or if you have one of several medical conditions. You may also need additional booster doses. Pneumococcal conjugate (PCV13) vaccine  You may need this if you have certain conditions and were not  previously vaccinated. Pneumococcal polysaccharide (PPSV23) vaccine  You may need one or two doses if you smoke cigarettes or if you have certain conditions. Hepatitis A vaccine  You may need this if you have certain conditions or if you travel or work in places where you may be exposed to hepatitis A. Hepatitis B vaccine  You may need this if you have certain conditions or if you travel or work in places where you may be exposed to hepatitis B. Haemophilus influenzae type b (Hib) vaccine  You may need this if you have certain risk factors. You may receive vaccines as individual doses or as more than one vaccine together in one shot (combination vaccines). Talk with your health care provider about the risks and benefits of combination vaccines. What tests do I need? Blood tests  Lipid and cholesterol levels. These may be checked every 5 years starting at age 17.  Hepatitis C test.  Hepatitis B test. Screening   Diabetes screening. This is done by checking your blood sugar (glucose) after you have not eaten for a while (fasting).  Sexually transmitted disease (STD) testing. Talk with your health care provider about your test results, treatment options, and if necessary, the need for more tests. Follow these instructions at home: Eating and drinking   Eat a diet that includes fresh fruits and vegetables, whole grains, lean protein, and low-fat dairy products.  Take vitamin and mineral supplements as recommended by your health care provider.  Do not drink alcohol if your health care provider tells you not to drink.  If you drink alcohol: ? Limit how much you have to 0-2  drinks a day. ? Be aware of how much alcohol is in your drink. In the [place] one drink equals one 12 oz bottle of beer (355 mL), one 5 oz glass of wine (148 mL), or one 1 oz glass of hard liquor (44 mL). Lifestyle  Take daily care of your teeth and gums.  Stay active. Exercise for at least 30 minutes on 5 or  more days each week.  Do not use any products that contain nicotine or tobacco, such as cigarettes, e-cigarettes, and chewing tobacco. If you need help quitting, ask your health care provider.  If you are sexually active, practice safe sex. Use a condom or other form of protection to prevent STIs (sexually transmitted infections). What's next?  Go to your health care provider once a year for a well check visit.  Ask your health care provider how often you should have your eyes and teeth checked.  Stay up to date on all vaccines. This information is not intended to replace advice given to you by your health care provider. Make sure you discuss any questions you have with your health care provider. Document Released: 07/14/2001 Document Revised: 05/12/2018 Document Reviewed: 05/12/2018 Elsevier Patient Education  2020 Elsevier Inc.  

## 2018-12-28 NOTE — Progress Notes (Signed)
Subjective:     Aaron Clements is a 23 y.o. male and is here for a comprehensive physical exam. Pt is not fasting this am.  The patient reports problems - occasional odor to urine.Pt states he is drinking at least 8 bottles of water per day.  Also drinking cranberry juice.  Is using a pre work out supplement 4x/wk.  Has a varied diet, does not recall eating excess asparagus or broccoli.  Pt trying to stay safe at work.  Pt is a Engineer, structural.  Had a recent COVID test which was negative.  Social History   Socioeconomic History  . Marital status: Single    Spouse name: Not on file  . Number of children: 0  . Years of education: 32  . Highest education level: Not on file  Occupational History  . 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 Occupation: Therapist, sports: [employer]  Social Needs  . Financial resource strain: Not on file  . Food insecurity    Worry: Not on file    Inability: Not on file  . Transportation needs    Medical: Not on file    Non-medical: Not on file  Tobacco Use  . Smoking status: Never Smoker  . Smokeless tobacco: Never Used  Substance and Sexual Activity  . Alcohol use: No  . Drug use: No  . Sexual activity: Not on file  Lifestyle  . Physical activity    Days per week: Not on file    Minutes per session: Not on file  . Stress: Not on file  Relationships  . Social Herbalist on phone: Not on file    Gets together: Not on file    Attends religious service: Not on file    Active member of club or organization: Not on file    Attends meetings of clubs or organizations: Not on file    Relationship status: Not on file  . Intimate partner violence    Fear of current or ex partner: Not on file    Emotionally abused: Not on file    Physically abused: Not on file    Forced sexual activity: Not on file  Other Topics Concern  . Not on file  Social History Narrative   Lives with mother   No caffeine   Health  Maintenance  Topic Date Due  . HIV Screening  08/21/2008  . INFLUENZA VACCINE  12/31/2018  . TETANUS/TDAP  09/09/2028    The following portions of the patient's history were reviewed and updated as appropriate: allergies, current medications, past family history, past medical history, past social history, past surgical history and problem list.  Review of Systems Pertinent items noted in HPI and remainder of comprehensive ROS otherwise negative.   Objective:    BP 118/60 (BP Location: Left Arm, Patient Position: Sitting, Cuff Size: Large)   Pulse 60   Temp 97.7 F (36.5 C) (Oral)   Ht 5\' 10"  (1.778 m)   Wt 195 lb 9.6 oz (88.7 kg)   SpO2 95%   BMI 28.07 kg/m  General appearance: alert, cooperative and no distress Head: Normocephalic, without obvious abnormality, atraumatic Eyes: conjunctivae/corneas clear. PERRL, EOM's intact. Fundi benign. Ears: normal TM's and external ear canals both ears Nose: Nares normal. Septum midline. Mucosa normal. No drainage or sinus tenderness. Throat: lips, mucosa, and tongue normal; teeth and gums normal Neck: no adenopathy, no carotid bruit, no JVD, supple,  symmetrical, trachea midline and thyroid not enlarged, symmetric, no tenderness/mass/nodules Lungs: clear to auscultation bilaterally Heart: regular rate and rhythm, S1, S2 normal, no murmur, click, rub or gallop Abdomen: soft, non-tender; bowel sounds normal; no masses,  no organomegaly Extremities: extremities normal, atraumatic, no cyanosis or edema Pulses: 2+ and symmetric Skin: Skin color, texture, turgor normal. No rashes or lesions Lymph nodes: Cervical, supraclavicular, and axillary nodes normal. Neurologic: Alert and oriented X 3, normal strength and tone. Normal symmetric reflexes. Normal coordination and gait    Assessment:    Healthy male exam.     Plan:     Anticipatory guidance given including wearing seatbelts, smoke detectors in the home, increasing physical activity,  increasing p.o. intake of water and vegetables. -will obtain labs when pt is fasting. -given handout See After Visit Summary for Counseling Recommendations   -next CPE in 1 yr  Malodorous urine -UA  Screen for STIs -will obtain labs  Screen for cholesterol  -lipid panel  F/u prn  Abbe AmsterdamShannon Ellarae Nevitt, MD

## 2018-12-29 LAB — C. TRACHOMATIS/N. GONORRHOEAE RNA
C. trachomatis RNA, TMA: NOT DETECTED
N. gonorrhoeae RNA, TMA: NOT DETECTED

## 2019-01-05 ENCOUNTER — Other Ambulatory Visit: Payer: 59

## 2019-01-23 ENCOUNTER — Other Ambulatory Visit: Payer: Self-pay

## 2019-01-23 ENCOUNTER — Other Ambulatory Visit (INDEPENDENT_AMBULATORY_CARE_PROVIDER_SITE_OTHER): Payer: 59

## 2019-01-23 DIAGNOSIS — Z13220 Encounter for screening for lipoid disorders: Secondary | ICD-10-CM | POA: Diagnosis not present

## 2019-01-23 DIAGNOSIS — Z0000 Encounter for general adult medical examination without abnormal findings: Secondary | ICD-10-CM | POA: Diagnosis not present

## 2019-01-23 DIAGNOSIS — R8290 Unspecified abnormal findings in urine: Secondary | ICD-10-CM

## 2019-01-23 DIAGNOSIS — Z113 Encounter for screening for infections with a predominantly sexual mode of transmission: Secondary | ICD-10-CM | POA: Diagnosis not present

## 2019-01-23 DIAGNOSIS — Z131 Encounter for screening for diabetes mellitus: Secondary | ICD-10-CM | POA: Diagnosis not present

## 2019-01-23 LAB — CBC WITH DIFFERENTIAL/PLATELET
Basophils Absolute: 0 10*3/uL (ref 0.0–0.1)
Basophils Relative: 0.3 % (ref 0.0–3.0)
Eosinophils Absolute: 0.1 10*3/uL (ref 0.0–0.7)
Eosinophils Relative: 1.4 % (ref 0.0–5.0)
HCT: 44.5 % (ref 39.0–52.0)
Hemoglobin: 15.2 g/dL (ref 13.0–17.0)
Lymphocytes Relative: 50.3 % — ABNORMAL HIGH (ref 12.0–46.0)
Lymphs Abs: 2.1 10*3/uL (ref 0.7–4.0)
MCHC: 34.1 g/dL (ref 30.0–36.0)
MCV: 91.9 fl (ref 78.0–100.0)
Monocytes Absolute: 0.5 10*3/uL (ref 0.1–1.0)
Monocytes Relative: 12.2 % — ABNORMAL HIGH (ref 3.0–12.0)
Neutro Abs: 1.5 10*3/uL (ref 1.4–7.7)
Neutrophils Relative %: 35.8 % — ABNORMAL LOW (ref 43.0–77.0)
Platelets: 244 10*3/uL (ref 150.0–400.0)
RBC: 4.84 Mil/uL (ref 4.22–5.81)
RDW: 13 % (ref 11.5–15.5)
WBC: 4.1 10*3/uL (ref 4.0–10.5)

## 2019-01-23 LAB — LIPID PANEL
Cholesterol: 159 mg/dL (ref 0–200)
HDL: 54.9 mg/dL (ref >39.00)
LDL Cholesterol: 93 mg/dL (ref 0–99)
NonHDL: 104.09
Total CHOL/HDL Ratio: 3
Triglycerides: 54 mg/dL (ref 0.0–149.0)
VLDL: 10.8 mg/dL (ref 0.0–40.0)

## 2019-01-23 LAB — COMPREHENSIVE METABOLIC PANEL
ALT: 27 U/L (ref 0–53)
AST: 33 U/L (ref 0–37)
Albumin: 4.5 g/dL (ref 3.5–5.2)
Alkaline Phosphatase: 75 U/L (ref 39–117)
BUN: 16 mg/dL (ref 6–23)
CO2: 28 mEq/L (ref 19–32)
Calcium: 9.1 mg/dL (ref 8.4–10.5)
Chloride: 103 mEq/L (ref 96–112)
Creatinine, Ser: 1.43 mg/dL (ref 0.40–1.50)
GFR: 72.66 mL/min (ref >60.00)
Glucose, Bld: 86 mg/dL (ref 70–99)
Potassium: 4.1 mEq/L (ref 3.5–5.1)
Sodium: 137 mEq/L (ref 135–145)
Total Bilirubin: 0.9 mg/dL (ref 0.2–1.2)
Total Protein: 6.5 g/dL (ref 6.0–8.3)

## 2019-01-23 LAB — HEMOGLOBIN A1C: Hgb A1c MFr Bld: 5.5 % (ref 4.6–6.5)

## 2019-01-24 LAB — RPR: RPR Ser Ql: NONREACTIVE

## 2019-01-24 LAB — HIV ANTIBODY (ROUTINE TESTING W REFLEX): HIV 1&2 Ab, 4th Generation: NONREACTIVE

## 2019-03-29 ENCOUNTER — Other Ambulatory Visit: Payer: Self-pay

## 2019-03-29 DIAGNOSIS — Z20822 Contact with and (suspected) exposure to covid-19: Secondary | ICD-10-CM

## 2019-04-03 ENCOUNTER — Telehealth: Payer: Self-pay

## 2019-04-03 ENCOUNTER — Other Ambulatory Visit: Payer: Self-pay

## 2019-04-03 DIAGNOSIS — Z20822 Contact with and (suspected) exposure to covid-19: Secondary | ICD-10-CM

## 2019-04-03 NOTE — Telephone Encounter (Signed)
Copied from [place] [id number]. Topic: General - Other >> Mar 29, 2019  1:53 PM Celene Kras A wrote: Reason for CRM: Pt called stating he is needing to have his results for his blood work and std testing. Please advise.

## 2019-04-05 LAB — NOVEL CORONAVIRUS, NAA: SARS-CoV-2, NAA: NOT DETECTED

## 2019-04-24 ENCOUNTER — Other Ambulatory Visit: Payer: Self-pay

## 2019-04-24 DIAGNOSIS — Z20822 Contact with and (suspected) exposure to covid-19: Secondary | ICD-10-CM

## 2019-04-25 LAB — NOVEL CORONAVIRUS, NAA: SARS-CoV-2, NAA: NOT DETECTED

## 2019-05-25 ENCOUNTER — Ambulatory Visit: Payer: 59 | Attending: Internal Medicine

## 2019-05-25 DIAGNOSIS — Z20822 Contact with and (suspected) exposure to covid-19: Secondary | ICD-10-CM

## 2019-05-26 LAB — NOVEL CORONAVIRUS, NAA: SARS-CoV-2, NAA: NOT DETECTED

## 2019-06-23 ENCOUNTER — Other Ambulatory Visit: Payer: 59

## 2019-06-27 ENCOUNTER — Ambulatory Visit: Payer: 59 | Attending: Internal Medicine

## 2019-06-27 ENCOUNTER — Ambulatory Visit: Payer: 59

## 2019-06-27 DIAGNOSIS — Z20822 Contact with and (suspected) exposure to covid-19: Secondary | ICD-10-CM

## 2019-06-28 LAB — NOVEL CORONAVIRUS, NAA: SARS-CoV-2, NAA: NOT DETECTED

## 2019-07-13 ENCOUNTER — Ambulatory Visit: Payer: 59 | Attending: Internal Medicine

## 2019-07-13 DIAGNOSIS — Z20822 Contact with and (suspected) exposure to covid-19: Secondary | ICD-10-CM

## 2019-07-14 LAB — NOVEL CORONAVIRUS, NAA: SARS-CoV-2, NAA: NOT DETECTED

## 2019-09-06 ENCOUNTER — Ambulatory Visit: Payer: 59 | Attending: Internal Medicine

## 2019-09-06 DIAGNOSIS — Z20822 Contact with and (suspected) exposure to covid-19: Secondary | ICD-10-CM

## 2019-09-07 LAB — NOVEL CORONAVIRUS, NAA: SARS-CoV-2, NAA: NOT DETECTED

## 2019-09-07 LAB — SARS-COV-2, NAA 2 DAY TAT

## 2019-10-02 ENCOUNTER — Ambulatory Visit: Payer: 59 | Attending: Internal Medicine

## 2019-10-02 DIAGNOSIS — Z20822 Contact with and (suspected) exposure to covid-19: Secondary | ICD-10-CM

## 2019-10-03 LAB — NOVEL CORONAVIRUS, NAA: SARS-CoV-2, NAA: NOT DETECTED

## 2019-10-03 LAB — SARS-COV-2, NAA 2 DAY TAT

## 2019-10-11 ENCOUNTER — Ambulatory Visit
Admission: RE | Admit: 2019-10-11 | Discharge: 2019-10-11 | Disposition: A | Discharge status: Home / Self Care | Payer: No Typology Code available for payment source | Source: Ambulatory Visit | Attending: Nurse Practitioner | Admitting: Nurse Practitioner

## 2019-10-11 ENCOUNTER — Other Ambulatory Visit: Payer: Self-pay | Admitting: Nurse Practitioner

## 2019-10-11 DIAGNOSIS — S99912A Unspecified injury of left ankle, initial encounter: Secondary | ICD-10-CM

## 2019-11-20 ENCOUNTER — Other Ambulatory Visit: Payer: 59

## 2019-12-06 ENCOUNTER — Ambulatory Visit: Payer: 59 | Attending: Internal Medicine

## 2019-12-06 DIAGNOSIS — Z20822 Contact with and (suspected) exposure to covid-19: Secondary | ICD-10-CM

## 2019-12-07 LAB — SARS-COV-2, NAA 2 DAY TAT

## 2019-12-07 LAB — NOVEL CORONAVIRUS, NAA: SARS-CoV-2, NAA: NOT DETECTED

## 2019-12-14 ENCOUNTER — Other Ambulatory Visit: Payer: Self-pay

## 2019-12-14 ENCOUNTER — Emergency Department (HOSPITAL_COMMUNITY)
Admission: EM | Admit: 2019-12-14 | Discharge: 2019-12-15 | Disposition: A | Discharge status: Home / Self Care | Payer: No Typology Code available for payment source | Attending: Emergency Medicine | Admitting: Emergency Medicine

## 2019-12-14 DIAGNOSIS — Y9389 Activity, other specified: Secondary | ICD-10-CM | POA: Insufficient documentation

## 2019-12-14 DIAGNOSIS — S199XXA Unspecified injury of neck, initial encounter: Secondary | ICD-10-CM | POA: Diagnosis present

## 2019-12-14 DIAGNOSIS — Y92410 Unspecified street and highway as the place of occurrence of the external cause: Secondary | ICD-10-CM | POA: Diagnosis not present

## 2019-12-14 DIAGNOSIS — S161XXA Strain of muscle, fascia and tendon at neck level, initial encounter: Secondary | ICD-10-CM

## 2019-12-14 DIAGNOSIS — Y990 Civilian activity done for income or pay: Secondary | ICD-10-CM | POA: Insufficient documentation
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
## 2019-12-15 NOTE — Discharge Instructions (Signed)
You may use over-the-counter Motrin (Ibuprofen), Acetaminophen (Tylenol), topical muscle creams such as SalonPas, [employer], Bengay, etc. Please stretch, apply ice for day 1 and 2, then heat, and have massage therapy for additional assistance.

## 2019-12-28 ENCOUNTER — Other Ambulatory Visit: Payer: 59

## 2020-01-12 ENCOUNTER — Other Ambulatory Visit: Payer: 59

## 2020-01-16 ENCOUNTER — Other Ambulatory Visit: Payer: 59

## 2020-05-01 ENCOUNTER — Other Ambulatory Visit: Payer: Self-pay

## 2020-05-01 ENCOUNTER — Encounter: Payer: Self-pay | Admitting: Family Medicine

## 2020-05-01 ENCOUNTER — Ambulatory Visit: Payer: 59 | Admitting: Family Medicine

## 2020-05-01 VITALS — BP 100/70 | HR 52 | Temp 98.4°F | Wt 191.4 lb

## 2020-05-01 DIAGNOSIS — R1032 Left lower quadrant pain: Secondary | ICD-10-CM

## 2020-05-01 NOTE — Patient Instructions (Signed)

## 2020-05-01 NOTE — Progress Notes (Signed)
Subjective:    Patient ID: Aaron Clements, male    DOB: 1995-12-11, 24 y.o.   MRN: [id number]  No chief complaint on file.   HPI Patient was seen today for acute concern.  Patient endorses left lower abdominal 1 week.  Symptoms started after lifting weights.  Sensation noted more as a 4-5/10 discomfort rather than a pain.  Worse with certain movements.  Patient thinks his sister had a hernia in the past.  Past Medical History:  Diagnosis Date  . Headache   . Migraines     No Known Allergies  ROS General: Denies fever, chills, night sweats, changes in weight, changes in appetite HEENT: Denies headaches, ear pain, changes in vision, rhinorrhea, sore throat CV: Denies CP, palpitations, SOB, orthopnea Pulm: Denies SOB, cough, wheezing GI: Denies abdominal pain, nausea, vomiting, diarrhea, constipation GU: Denies dysuria, hematuria, frequency Msk: Denies muscle cramps, joint pains  +L groin pain Neuro: Denies weakness, numbness, tingling Skin: Denies rashes, bruising Psych: Denies depression, anxiety, hallucinations     Objective:    Blood pressure 100/70, pulse (!) 52, temperature 98.4 F (36.9 C), temperature source Oral, weight 191 lb 6.4 oz (86.8 kg), SpO2 98 %.  Gen. Pleasant, well-nourished, in no distress, normal affect HEENT: [place]/AT, face symmetric, conjunctiva clear, no scleral icterus, PERRLA, EOMI, nares patent without drainage Lungs: no accessory muscle use, CTAB, no wheezes or rales Cardiovascular: RRR, no m/r/g, no peripheral edema Abdomen: BS present, soft, NT/ND. GU:  No fullness or bulging of b/l groin,  No inguinal lymphadenopathy. Subtle fullness on L with palpation of base of penis/upper scrotum.  Chaperone J.Rose, RN present. Musculoskeletal: No deformities, no cyanosis or clubbing, normal tone Neuro:  A&Ox3, CN II-XII intact, normal gait Skin:  Warm, no lesions/ rash   Wt Readings from Last 3 Encounters:  05/01/20 191 lb 6.4 oz (86.8 kg)   12/28/18 195 lb 9.6 oz (88.7 kg)  09/10/18 190 lb (86.2 kg)    Lab Results  Component Value Date   WBC 4.1 01/23/2019   HGB 15.2 01/23/2019   HCT 44.5 01/23/2019   PLT 244.0 01/23/2019   GLUCOSE 86 01/23/2019   CHOL 159 01/23/2019   TRIG 54.0 01/23/2019   HDL 54.90 01/23/2019   LDLCALC 93 01/23/2019   ALT 27 01/23/2019   AST 33 01/23/2019   NA 137 01/23/2019   K 4.1 01/23/2019   CL 103 01/23/2019   CREATININE 1.43 01/23/2019   BUN 16 01/23/2019   CO2 28 01/23/2019   HGBA1C 5.5 01/23/2019    Assessment/Plan:  Left groin pain  -Discussed possible causes including left inguinal hernia versus groin strain. -Discussed supportive care.  Patient advised to refrain from heavy lifting at this time. -We will obtain ultrasound -For positive imaging results will place referral to general surgery for hernia repair -Given handout -Given precautions - Plan: US Scrotum  F/u as needed  Abbe Amsterdam, MD

## 2020-05-02 ENCOUNTER — Telehealth: Payer: Self-pay | Admitting: Family Medicine

## 2020-05-02 NOTE — Telephone Encounter (Signed)
Aaron Clements is calling and wanted to see if the order that was put in for pt be changed to an ultrasound pelvic limited CP code is [phone], please advise. CB is [phone]

## 2020-05-03 NOTE — Telephone Encounter (Signed)
Miriam from [facility] needs an order changed for this patient  There order is for ultrasound scrotum and it needs to be changed to pelvis limited.  Pieter Partridge  [phone] ext [phone]

## 2020-05-03 NOTE — Telephone Encounter (Signed)
Please advise 

## 2020-05-03 NOTE — Telephone Encounter (Signed)
Okay to change? 

## 2020-05-07 NOTE — Telephone Encounter (Signed)
Pt is aware that the msg has been sent to the provider for correction and stated that he would like to have it done as soon as possible.  Pt would like to have a call back.

## 2020-05-13 ENCOUNTER — Telehealth: Payer: Self-pay | Admitting: Family Medicine

## 2020-05-13 ENCOUNTER — Other Ambulatory Visit: Payer: Self-pay

## 2020-05-13 DIAGNOSIS — R1032 Left lower quadrant pain: Secondary | ICD-10-CM

## 2020-05-13 NOTE — Telephone Encounter (Signed)
Aaron Clements is calling back from [facility] and is requesting a call back regarding patients order , please advise. CB is [phone] ext [phone]

## 2020-05-13 NOTE — Telephone Encounter (Signed)
Spoke with Aaron Clements stated that she received the new order and that she was calling pt for scheduling

## 2020-05-13 NOTE — Telephone Encounter (Signed)
New order for pelvic Limited has been placed on Epic, left a message for [facility] to call the office with any questions

## 2020-05-13 NOTE — Telephone Encounter (Signed)
error 

## 2020-05-16 ENCOUNTER — Ambulatory Visit
Admission: RE | Admit: 2020-05-16 | Discharge: 2020-05-16 | Disposition: A | Discharge status: Home / Self Care | Payer: 59 | Source: Ambulatory Visit | Attending: Family Medicine | Admitting: Family Medicine

## 2020-05-16 DIAGNOSIS — R1032 Left lower quadrant pain: Secondary | ICD-10-CM

## 2020-05-17 ENCOUNTER — Telehealth: Payer: Self-pay | Admitting: Family Medicine

## 2020-05-17 NOTE — Telephone Encounter (Signed)
Pt is calling in stating that he would like to see if he could get his [place] results and he is aware that when his provider gets the results and have resulted them someone will give him a call.

## 2020-05-20 ENCOUNTER — Other Ambulatory Visit: Payer: Self-pay

## 2020-05-20 DIAGNOSIS — K4090 Unilateral inguinal hernia, without obstruction or gangrene, not specified as recurrent: Secondary | ICD-10-CM

## 2020-05-20 NOTE — Telephone Encounter (Signed)
See result note.  

## 2020-05-27 ENCOUNTER — Telehealth: Payer: Self-pay | Admitting: Family Medicine

## 2020-05-27 NOTE — Telephone Encounter (Signed)
Pt is calling in stating that he would like to get scheduled with a surgeon and would like to have a call back.  Pt is aware that the provider and CMA is out of the office and will be back in the office in on Wednesday.

## 2020-05-29 NOTE — Telephone Encounter (Signed)
Spoke with pt advised per the office referral coordinator  the surgery office should call him to schedule, pt verbalized understanding

## 2020-07-12 ENCOUNTER — Telehealth: Payer: Self-pay

## 2020-07-12 NOTE — Telephone Encounter (Signed)
Patient called in wanting another referral sent. He has not had a good turn out as far as communication and getting schd with [facility]. Patient is asking for a referral sent somewhere in [place] or [place] area    Please call and advise

## 2020-07-16 NOTE — Telephone Encounter (Signed)
Spoke with pt aware that he is scheduled for appointment with [facility] on 08/01/2020

## 2020-09-16 ENCOUNTER — Encounter: Payer: Self-pay | Admitting: Family Medicine

## 2020-09-16 ENCOUNTER — Ambulatory Visit: Payer: 59 | Admitting: Family Medicine

## 2020-09-16 ENCOUNTER — Other Ambulatory Visit: Payer: Self-pay

## 2020-09-16 VITALS — BP 124/68 | HR 91 | Temp 98.4°F | Wt 193.0 lb

## 2020-09-16 DIAGNOSIS — F439 Reaction to severe stress, unspecified: Secondary | ICD-10-CM | POA: Diagnosis not present

## 2020-09-16 DIAGNOSIS — R519 Headache, unspecified: Secondary | ICD-10-CM

## 2020-09-16 DIAGNOSIS — R030 Elevated blood-pressure reading, without diagnosis of hypertension: Secondary | ICD-10-CM

## 2020-09-16 NOTE — Patient Instructions (Addendum)
Preventing Hypertension Hypertension, also called high blood pressure, is when the force of blood pumping through the arteries is too strong. Arteries are blood vessels that carry blood from the heart throughout the body. Often, hypertension does not cause symptoms until blood pressure is very high. It is important to have your blood pressure checked regularly. Diet and lifestyle changes can help you prevent hypertension, and they may make you feel better overall and improve your quality of life. If you already have hypertension, you may control it with diet and lifestyle changes, as well as with medicine. How can this condition affect me? Over time, hypertension can damage the arteries and decrease blood flow to important parts of the body, including the brain, heart, and kidneys. By keeping your blood pressure in a healthy range, you can help prevent complications like heart attack, heart failure, stroke, kidney failure, and vascular dementia. What can increase my risk?  Being an older adult. Older people are more often affected.  Having family members who have had high blood pressure.  Being obese.  Being male. Males are more likely to have high blood pressure.  Drinking too much alcohol or caffeine.  Smoking or using illegal drugs.  Taking certain medicines, such as antidepressants, decongestants, birth control pills, and NSAIDs, such as ibuprofen.  Having thyroid problems.  Having certain tumors. What actions can I take to prevent or manage this condition? Work with your health care provider to make a hypertension prevention plan that works for you. Follow your plan and keep all follow-up visits as told by your health care provider. Diet changes Maintain a healthy diet. This includes:  Eating less salt (sodium). Ask your health care provider how much sodium is safe for you to have. The general recommendation is to have less than 1 tsp (2,300 mg) of sodium a day. ? Do not add salt  to your food. ? Choose low-sodium options when grocery shopping and eating out.  Limiting fats in your diet. You can do this by eating low-fat or fat-free dairy products and by eating less red meat.  Eating more fruits, vegetables, and whole grains. Make a goal to eat: ? 1-2 cups of fresh fruits and vegetables each day. ? 3-4 servings of whole grains each day.  Avoiding foods and beverages that have added sugars.  Eating fish that contain healthy fats (omega-3 fatty acids), such as mackerel or salmon. If you need help putting together a healthy eating plan, try the DASH diet. This diet is high in fruits, vegetables, and whole grains. It is low in sodium, red meat, and added sugars. DASH stands for Dietary Approaches to Stop Hypertension.   Lifestyle changes Lose weight if you are overweight. Losing just 3?5% of your body weight can help prevent or control hypertension. For example, if your present weight is 200 lb (91 kg), a loss of 3-5% of your weight means losing 6-10 lb (2.7-4.5 kg). Ask your health care provider to help you with a diet and exercise plan to safely lose weight. Other recommendations usually include:  Get enough exercise. Do at least 150 minutes of moderate-intensity exercise each week. You could do this in short exercise sessions several times a day, or you could do longer exercise sessions a few times a week. For example, you could take a brisk 10-minute walk or bike ride, 3 times a day, for 5 days a week.  Find ways to reduce stress, such as exercising, meditating, listening to music, or taking a yoga class.  If you need help reducing stress, ask your health care provider.  Do not use any products that contain nicotine or tobacco, such as cigarettes, e-cigarettes, and chewing tobacco. If you need help quitting, ask your health care provider. Chemicals in tobacco and nicotine products raise your blood pressure each time you use them. If you need help quitting, ask your health  care provider.  Learn how to check your blood pressure at home. Make sure that you know your personal target blood pressure, as told by your health care provider.  Try to sleep 7-9 hours per night.   Alcohol use  Do not drink alcohol if: ? Your health care provider tells you not to drink. ? You are pregnant, may be pregnant, or are planning to become pregnant.  If you drink alcohol: ? Limit how much you use to:  0-1 drink a day for women.  0-2 drinks a day for men. ? Be aware of how much alcohol is in your drink. In the [place] one drink equals one 12 oz bottle of beer (355 mL), one 5 oz glass of wine (148 mL), or one 1 oz glass of hard liquor (44 mL). Medicines In addition to diet and lifestyle changes, your health care provider may recommend medicines to help lower your blood pressure. In general:  You may need to try a few different medicines to find what works best for you.  You may need to take more than one medicine.  Take over-the-counter and prescription medicines only as told by your health care provider. Questions to ask your health care provider  What is my blood pressure goal?  How can I lower my risk for high blood pressure?  How should I monitor my blood pressure at home? Where to find support Your health care provider can help you prevent hypertension and help you keep your blood pressure at a healthy level. Your local hospital or your community may also provide support services and prevention programs. The American Heart Association offers an online support network at supportnetwork.heart.org Where to find more information Learn more about hypertension from:  [place], Lung, and [place]: https://wilson-eaton.com/  Centers for Disease Control and Prevention: http://www.wolf.info/  American Academy of Family Physicians: familydoctor.org Learn more about the DASH diet from:  [place], Lung, and [place]: https://wilson-eaton.com/ Contact a health care  provider if:  You think you are having a reaction to medicines you have taken.  You have recurrent headaches or feel dizzy.  You have swelling in your ankles.  You have trouble with your vision. Get help right away if:  You have sudden, severe chest, back, or abdominal pain or discomfort.  You have shortness of breath.  You have a sudden, severe headache. These symptoms may represent a serious problem that is an emergency. Do not wait to see if the symptoms will go away. Get medical help right away. Call your local emergency services (911 in the [place] Do not drive yourself to the hospital.  Summary  Hypertension often does not cause any symptoms until blood pressure is very high. It is important to get your blood pressure checked regularly.  Diet and lifestyle changes are important steps in preventing hypertension.  By keeping your blood pressure in a healthy range, you may prevent complications like heart attack, heart failure, stroke, and kidney failure.  Work with your health care provider to make a hypertension prevention plan that works for you. This information is not intended to replace advice given to  you by your health care provider. Make sure you discuss any questions you have with your health care provider. Document Revised: 04/18/2019 Document Reviewed: 04/18/2019 Elsevier Patient Education  2021 [place] of family medicine (9th ed., pp. [phone]). [place], [place]: [employer].">  Stress, Adult Stress is a normal reaction to life events. Stress is what you feel when life demands more than you are used to, or more than you think you can handle. Some stress can be useful, such as studying for a test or meeting a deadline at work. Stress that occurs too often or for too long can cause problems. It can affect your emotional health and interfere with relationships and normal daily activities. Too much stress can weaken your body's defense system (immune system)  and increase your risk for physical illness. If you already have a medical problem, stress can make it worse. What are the causes? All sorts of life events can cause stress. An event that causes stress for one person may not be stressful for another person. Major life events, whether positive or negative, commonly cause stress. Examples include:  Losing a job or starting a new job.  Losing a loved one.  Moving to a new town or home.  Getting married or divorced.  Having a baby.  Getting injured or sick. Less obvious life events can also cause stress, especially if they occur day after day or in combination with each other. Examples include:  Working long hours.  Driving in traffic.  Caring for children.  Being in debt.  Being in a difficult relationship. What are the signs or symptoms? Stress can cause emotional symptoms, including:  Anxiety. This is feeling worried, afraid, on edge, overwhelmed, or out of control.  Anger, including irritation or impatience.  Depression. This is feeling sad, down, helpless, or guilty.  Trouble focusing, remembering, or making decisions. Stress can cause physical symptoms, including:  Aches and pains. These may affect your head, neck, back, stomach, or other areas of your body.  Tight muscles or a clenched jaw.  Low energy.  Trouble sleeping. Stress can cause unhealthy behaviors, including:  Eating to feel better (overeating) or skipping meals.  Working too much or putting off tasks.  Smoking, drinking alcohol, or using drugs to feel better. How is this diagnosed? Stress is diagnosed through an assessment by your health care provider. He or she may diagnose this condition based on:  Your symptoms and any stressful life events.  Your medical history.  Tests to rule out other causes of your symptoms. Depending on your condition, your health care provider may refer you to a specialist for further evaluation. How is this  treated? Stress management techniques are the recommended treatment for stress. Medicine is not typically recommended for the treatment of stress. Techniques to reduce your reaction to stressful life events include:  Stress identification. Monitor yourself for symptoms of stress and identify what causes stress for you. These skills may help you to avoid or prepare for stressful events.  Time management. Set your priorities, keep a calendar of events, and learn to say no. Taking these actions can help you avoid making too many commitments. Techniques for coping with stress include:  Rethinking the problem. Try to think realistically about stressful events rather than ignoring them or overreacting. Try to find the positives in a stressful situation rather than focusing on the negatives.  Exercise. Physical exercise can release both physical and emotional tension. The key is to find a form of exercise  that you enjoy and do it regularly.  Relaxation techniques. These relax the body and mind. The key is to find one or more that you enjoy and use the techniques regularly. Examples include: ? Meditation, deep breathing, or progressive relaxation techniques. ? Yoga or tai chi. ? Biofeedback, mindfulness techniques, or journaling. ? Listening to music, being out in nature, or participating in other hobbies.  Practicing a healthy lifestyle. Eat a balanced diet, drink plenty of water, limit or avoid caffeine, and get plenty of sleep.  Having a strong support network. Spend time with family, friends, or other people you enjoy being around. Express your feelings and talk things over with someone you trust. Counseling or talk therapy with a mental health professional may be helpful if you are having trouble managing stress on your own.   Follow these instructions at home: Lifestyle  Avoid drugs.  Do not use any products that contain nicotine or tobacco, such as cigarettes, e-cigarettes, and chewing  tobacco. If you need help quitting, ask your health care provider.  Limit alcohol intake to no more than 1 drink a day for nonpregnant women and 2 drinks a day for men. One drink equals 12 oz of beer, 5 oz of wine, or 1 oz of hard liquor  Do not use alcohol or drugs to relax.  Eat a balanced diet that includes fresh fruits and vegetables, whole grains, lean meats, fish, eggs, and beans, and low-fat dairy. Avoid processed foods and foods high in added fat, sugar, and salt.  Exercise at least 30 minutes on 5 or more days each week.  Get 7-8 hours of sleep each night.   General instructions  Practice stress management techniques as discussed with your health care provider.  Drink enough fluid to keep your urine clear or pale yellow.  Take over-the-counter and prescription medicines only as told by your health care provider.  Keep all follow-up visits as told by your health care provider. This is important.   Contact a health care provider if:  Your symptoms get worse.  You have new symptoms.  You feel overwhelmed by your problems and can no longer manage them on your own. Get help right away if:  You have thoughts of hurting yourself or others. If you ever feel like you may hurt yourself or others, or have thoughts about taking your own life, get help right away. You can go to your nearest emergency department or call:  Your local emergency services (911 in the [place]  A suicide crisis helpline, such as the [facility] at 351-146-6746. This is open 24 hours a day. Summary  Stress is a normal reaction to life events. It can cause problems if it happens too often or for too long.  Practicing stress management techniques is the best way to treat stress.  Counseling or talk therapy with a mental health professional may be helpful if you are having trouble managing stress on your own. This information is not intended to replace advice given to you by your  health care provider. Make sure you discuss any questions you have with your health care provider. Document Revised: 02/02/2020 Document Reviewed: 02/02/2020 Elsevier Patient Education  2021 [employer].

## 2020-09-16 NOTE — Progress Notes (Signed)
Subjective:    Patient ID: Aaron Clements, male    DOB: 06-Apr-1996, 25 y.o.   MRN: [id number]  No chief complaint on file.   HPI Patient was seen today for acute concern.  Pt with elevated bp x several wks.  bp 148/89 at work.  Pt denies any changes, staying active.  Has hernia repair surgery in May.  Pt also studying for a test.  Pt eating fast food 2x/wk, drinking 1 gal of water/day, and exercising regularly.  Pt notes difficulty falling asleep, may wake up and pace.  Notes often waking up with a HA.  Denies being told he snores or daytime somnolence.  Drinking 1 energy drink per day.  Pt's father has a h/o HTN.  Patient currently employed as a Emergency planning/management officer.  Pt wants to actively help his community by creating a nonprofit.  Past Medical History:  Diagnosis Date  . Headache   . Migraines     No Known Allergies  ROS General: Denies fever, chills, night sweats, changes in weight, changes in appetite  +insomnia HEENT: Denies ear pain, changes in vision, rhinorrhea, sore throat  +HAs CV: Denies CP, palpitations, SOB, orthopnea Pulm: Denies SOB, cough, wheezing GI: Denies abdominal pain, nausea, vomiting, diarrhea, constipation GU: Denies dysuria, hematuria, frequency Msk: Denies muscle cramps, joint pains Neuro: Denies weakness, numbness, tingling Skin: Denies rashes, bruising Psych: Denies depression, anxiety, hallucinations + stress    Objective:    Blood pressure 140/90, pulse 91, temperature 98.4 F (36.9 C), temperature source Oral, weight 193 lb (87.5 kg), SpO2 97 %.  Repeat BP 124/68  Gen. Pleasant, well-nourished, in no distress, normal affect   HEENT: [place]/AT, face symmetric, conjunctiva clear, no scleral icterus, PERRLA, EOMI, nares patent without drainage Lungs: no accessory muscle use, CTAB, no wheezes or rales Cardiovascular: RRR, no m/r/g, no peripheral edema Musculoskeletal: No deformities, no cyanosis or clubbing, normal tone Neuro:  A&Ox3, CN II-XII  intact, normal gait Skin:  Warm, no lesions/ rash   Wt Readings from Last 3 Encounters:  09/16/20 193 lb (87.5 kg)  05/01/20 191 lb 6.4 oz (86.8 kg)  12/28/18 195 lb 9.6 oz (88.7 kg)    Lab Results  Component Value Date   WBC 4.1 01/23/2019   HGB 15.2 01/23/2019   HCT 44.5 01/23/2019   PLT 244.0 01/23/2019   GLUCOSE 86 01/23/2019   CHOL 159 01/23/2019   TRIG 54.0 01/23/2019   HDL 54.90 01/23/2019   LDLCALC 93 01/23/2019   ALT 27 01/23/2019   AST 33 01/23/2019   NA 137 01/23/2019   K 4.1 01/23/2019   CL 103 01/23/2019   CREATININE 1.43 01/23/2019   BUN 16 01/23/2019   CO2 28 01/23/2019   HGBA1C 5.5 01/23/2019    Assessment/Plan:  Elevated blood pressure reading without diagnosis of hypertension -elevated.  Recheck bp 124/68 -continue lifestyle modifications -pt to check bp daily at work and keep a log to bring with him to clinic -We will obtain labs -For continued elevation consider BP medication -Given handouts  - Plan: Basic metabolic panel, TSH, T4, Free, CBC with Differential/Platelet  Nonintractable headache, unspecified chronicity pattern, unspecified headache type  -Discussed possible causes including insomnia, elevated BP, increased stress/anxiety -Discussed self-care -Discussed limiting caffeine intake - Plan: Basic metabolic panel, CBC with Differential/Platelet  Stress -Discussed may be contributing to BP elevation and headache -Discussed self-care and ways to reduce stress -Given handout  F/u in 2-4 wks  Abbe [place], MD

## 2021-05-05 ENCOUNTER — Ambulatory Visit (INDEPENDENT_AMBULATORY_CARE_PROVIDER_SITE_OTHER): Payer: 59 | Admitting: *Deleted

## 2021-05-05 DIAGNOSIS — Z23 Encounter for immunization: Secondary | ICD-10-CM | POA: Diagnosis not present

## 2022-02-25 ENCOUNTER — Ambulatory Visit (INDEPENDENT_AMBULATORY_CARE_PROVIDER_SITE_OTHER): Payer: Self-pay | Admitting: Adult Health

## 2022-02-25 ENCOUNTER — Encounter: Payer: Self-pay | Admitting: Adult Health

## 2022-02-25 VITALS — BP 132/70 | HR 58 | Temp 98.6°F | Wt 200.0 lb

## 2022-02-25 DIAGNOSIS — K5900 Constipation, unspecified: Secondary | ICD-10-CM

## 2022-02-25 NOTE — Progress Notes (Signed)
Subjective:    Patient ID: Aaron Clements, male    DOB: February 05, 1996, 26 y.o.   MRN: [id number]  HPI 26 year old male who  has a past medical history of Headache and Migraines.  He presents to the office today for an acute issue of abdominal pain.  He reports that over the last 3 days he has had abdominal discomfort and a feeling of "fullness and cramping".  Today his symptoms started he was nauseated for short period of time.  He has tried Pepto-Bismol and Alka-Seltzer without improvement.  On day 1 he did not have a bowel movement, day 2 he had 1 episode of diarrhea, and today has not had a bowel movement.  He denies fevers, vomiting, or inability to pass gas.  He also has not experienced any GERD symptoms.  He does not smoke marijuana.  He has not started any new medications or supplements.   Review of Systems See HPI   Past Medical History:  Diagnosis Date   Headache    Migraines     Social History   Socioeconomic History   Marital status: Single    Spouse name: Not on file   Number of children: 0   Years of education: 15   Highest education level: Not on file  Occupational History   Occupation: student    Comment: [employer], criminal justice   Occupation: Therapist, sports: [employer]  Tobacco Use   Smoking status: Never   Smokeless tobacco: Never  Vaping Use   Vaping Use: Never used  Substance and Sexual Activity   Alcohol use: No   Drug use: No   Sexual activity: Not on file  Other Topics Concern   Not on file  Social History Narrative   Lives with mother   No caffeine   Social Determinants of Health   Financial Resource Strain: Not on file  Food Insecurity: Not on file  Transportation Needs: Not on file  Physical Activity: Not on file  Stress: Not on file  Social Connections: Not on file  Intimate Partner Violence: Not on file    No past surgical history on file.  Family History  Problem Relation Age of Onset   Colon cancer Maternal  Grandmother    Cancer Maternal Grandmother    Prostate cancer Maternal Grandfather    Cancer Maternal Grandfather     No Known Allergies  Current Outpatient Medications on File Prior to Visit  Medication Sig Dispense Refill   Acetaminophen (TYLENOL PO) Take by mouth as needed.     aspirin-acetaminophen-caffeine (EXCEDRIN MIGRAINE) 250-250-65 MG tablet Take 2 tablets by mouth every 6 (six) hours as needed for headache.     No current facility-administered medications on file prior to visit.    BP 132/70 (BP Location: Left Arm, Patient Position: Sitting, Cuff Size: Large)   Pulse (!) 58   Temp 98.6 F (37 C) (Oral)   Wt 200 lb (90.7 kg)   SpO2 98%   BMI 28.70 kg/m       Objective:   Physical Exam Vitals and nursing note reviewed.  Constitutional:      Appearance: He is well-developed.  Cardiovascular:     Rate and Rhythm: Normal rate and regular rhythm.     Pulses: Normal pulses.     Heart sounds: Normal heart sounds.  Pulmonary:     Effort: Pulmonary effort is normal.     Breath sounds: Normal breath sounds.  Abdominal:  General: Abdomen is flat. Bowel sounds are increased. There is no distension.     Palpations: Abdomen is soft. There is no mass.     Tenderness: There is generalized abdominal tenderness. There is no guarding or rebound. Negative signs include Murphy's sign, Rovsing's sign, McBurney's sign, psoas sign and obturator sign.     Hernia: No hernia is present.     Comments: Hard stool felt on palpation  Neurological:     Mental Status: He is alert.        Assessment & Plan:  1. Constipation, unspecified constipation type -Constipation is likely cause of his abdominal discomfort.  Encouraged using natural stool softener such as warm prune juice or increasing fiber.  Follow-up if not relieved after bowel movement  Shirline Frees, NP

## 2022-10-28 ENCOUNTER — Encounter: Payer: Self-pay | Admitting: Family Medicine

## 2022-10-28 ENCOUNTER — Ambulatory Visit (INDEPENDENT_AMBULATORY_CARE_PROVIDER_SITE_OTHER): Payer: BC Managed Care – PPO | Admitting: Family Medicine

## 2022-10-28 VITALS — BP 110/84 | HR 50 | Temp 98.5°F | Ht 69.0 in | Wt 205.8 lb

## 2022-10-28 DIAGNOSIS — Z1159 Encounter for screening for other viral diseases: Secondary | ICD-10-CM | POA: Diagnosis not present

## 2022-10-28 DIAGNOSIS — M92522 Juvenile osteochondrosis of tibia tubercle, left leg: Secondary | ICD-10-CM | POA: Diagnosis not present

## 2022-10-28 DIAGNOSIS — Z113 Encounter for screening for infections with a predominantly sexual mode of transmission: Secondary | ICD-10-CM | POA: Diagnosis not present

## 2022-10-28 DIAGNOSIS — Z0000 Encounter for general adult medical examination without abnormal findings: Secondary | ICD-10-CM

## 2022-10-28 LAB — COMPREHENSIVE METABOLIC PANEL
ALT: 22 U/L (ref 0–53)
AST: 27 U/L (ref 0–37)
Albumin: 4.2 g/dL (ref 3.5–5.2)
Alkaline Phosphatase: 57 U/L (ref 39–117)
BUN: 17 mg/dL (ref 6–23)
CO2: 29 mEq/L (ref 19–32)
Calcium: 9.2 mg/dL (ref 8.4–10.5)
Chloride: 102 mEq/L (ref 96–112)
Creatinine, Ser: 1.54 mg/dL — ABNORMAL HIGH (ref 0.40–1.50)
GFR: 60.72 mL/min (ref >60.00)
Glucose, Bld: 82 mg/dL (ref 70–99)
Potassium: 4.2 mEq/L (ref 3.5–5.1)
Sodium: 137 mEq/L (ref 135–145)
Total Bilirubin: 0.9 mg/dL (ref 0.2–1.2)
Total Protein: 7 g/dL (ref 6.0–8.3)

## 2022-10-28 LAB — LIPID PANEL
Cholesterol: 160 mg/dL (ref 0–200)
HDL: 56.2 mg/dL (ref >39.00)
LDL Cholesterol: 92 mg/dL (ref 0–99)
NonHDL: 103.32
Total CHOL/HDL Ratio: 3
Triglycerides: 57 mg/dL (ref 0.0–149.0)
VLDL: 11.4 mg/dL (ref 0.0–40.0)

## 2022-10-28 LAB — CBC WITH DIFFERENTIAL/PLATELET
Basophils Absolute: 0 10*3/uL (ref 0.0–0.1)
Basophils Relative: 0.4 % (ref 0.0–3.0)
Eosinophils Absolute: 0.1 10*3/uL (ref 0.0–0.7)
Eosinophils Relative: 2.4 % (ref 0.0–5.0)
HCT: 44.6 % (ref 39.0–52.0)
Hemoglobin: 15.1 g/dL (ref 13.0–17.0)
Lymphocytes Relative: 40.4 % (ref 12.0–46.0)
Lymphs Abs: 1.8 10*3/uL (ref 0.7–4.0)
MCHC: 33.8 g/dL (ref 30.0–36.0)
MCV: 91.3 fl (ref 78.0–100.0)
Monocytes Absolute: 0.4 10*3/uL (ref 0.1–1.0)
Monocytes Relative: 9.5 % (ref 3.0–12.0)
Neutro Abs: 2.1 10*3/uL (ref 1.4–7.7)
Neutrophils Relative %: 47.3 % (ref 43.0–77.0)
Platelets: 271 10*3/uL (ref 150.0–400.0)
RBC: 4.89 Mil/uL (ref 4.22–5.81)
RDW: 12.8 % (ref 11.5–15.5)
WBC: 4.4 10*3/uL (ref 4.0–10.5)

## 2022-10-28 LAB — HEMOGLOBIN A1C: Hgb A1c MFr Bld: 5.3 % (ref 4.6–6.5)

## 2022-10-28 LAB — T4, FREE: Free T4: 1.05 ng/dL (ref 0.60–1.60)

## 2022-10-28 LAB — TSH: TSH: 1.01 u[IU]/mL (ref 0.35–5.50)

## 2022-10-28 NOTE — Patient Instructions (Signed)
[facility] Tracy Peck  [phone] [address] [place], [place] [address] [email or web address]   

## 2022-10-28 NOTE — Progress Notes (Addendum)
Established Patient Office Visit   Subjective  Patient ID: Aaron Clements, male    DOB: [birth year]  Age: 27 y.o. MRN: [id number]  Chief Complaint  Patient presents with   Annual Exam    Patient is a 27 year old male lost to follow-up.  Seen for CPE.  Patient states has been doing well overall.  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  Not currently on any medications.  Patient mentions physical therapy for history of Osgood Slaughter left leg.  In physical therapy.  Doing stretching and exercises to help loosen left quads.  Tried electrical stim, stretching, etc.  Now wearing knee sleeves when exercising.    Patient Active Problem List   Diagnosis Date Noted   Mild concussion 01/08/2017   Unspecified constipation 12/19/2012   No past surgical history on file. Social History   Tobacco Use   Smoking status: Never   Smokeless tobacco: Never  Vaping Use   Vaping Use: Never used  Substance Use Topics   Alcohol use: No   Drug use: No   Family History  Problem Relation Age of Onset   Colon cancer Maternal Grandmother    Cancer Maternal Grandmother    Prostate cancer Maternal Grandfather    Cancer Maternal Grandfather    No Known Allergies    ROS Negative unless stated above    Objective:     BP 110/84 (BP Location: Right Arm, Patient Position: Sitting, Cuff Size: Large)   Pulse (!) 50   Temp 98.5 F (36.9 C) (Oral)   Ht 5\' 9"  (1.753 m)   Wt 205 lb 12.8 oz (93.4 kg)   SpO2 98%   BMI 30.39 kg/m    Physical Exam Constitutional:      Appearance: Normal appearance.  HENT:     Head: Normocephalic and atraumatic.     Right Ear: Tympanic membrane, ear canal and external ear normal.     Left Ear: Tympanic membrane, ear canal and external ear normal.     Nose: Nose normal.     Mouth/Throat:     Mouth: Mucous membranes are moist.     Pharynx: No oropharyngeal exudate or posterior oropharyngeal erythema.  Eyes:     General:  No scleral icterus.    Extraocular Movements: Extraocular movements intact.     Conjunctiva/sclera: Conjunctivae normal.     Pupils: Pupils are equal, round, and reactive to light.  Neck:     Thyroid: No thyromegaly.  Cardiovascular:     Rate and Rhythm: Normal rate and regular rhythm.     Pulses: Normal pulses.     Heart sounds: Normal heart sounds. No murmur heard.    No friction rub.  Pulmonary:     Effort: Pulmonary effort is normal.     Breath sounds: Normal breath sounds. No wheezing, rhonchi or rales.  Abdominal:     General: Bowel sounds are normal.     Palpations: Abdomen is soft.     Tenderness: There is no abdominal tenderness.  Musculoskeletal:        General: No deformity. Normal range of motion.  Lymphadenopathy:     Cervical: No cervical adenopathy.  Skin:    General: Skin is warm and dry.     Findings: No lesion.  Neurological:     General: No focal deficit present.     Mental Status: He is alert and oriented to person, place, and time.  Psychiatric:  Mood and Affect: Mood normal.        Thought Content: Thought content normal.     No results found for any visits on 10/28/22.    Assessment & Plan:  Well adult exam -Age-appropriate screenings discussed -Immunizations reviewed -Next CPE in 1 year -     CBC with Differential/Platelet -     TSH -     T4, free -     Hemoglobin A1c -     Lipid panel -     Comprehensive metabolic panel -     Hepatitis C antibody  Screening examination for STI -     HIV Antibody (routine testing w rflx) -     RPR  Osgood-Schlatter's disease of left lower extremity -Discussed NSAIDs, brace/support band, topical analgesics, heat, ice -Also discussed alternative medicine options including acupuncture.  Consider water aerobics/swimming -Continue PT  Hepatitis C screening test for low risk patient        -    Hep C ab  Return if symptoms worsen or fail to improve.   Deeann Saint, MD

## 2022-10-29 LAB — RPR: RPR Ser Ql: NONREACTIVE

## 2022-10-29 LAB — HEPATITIS C ANTIBODY: Hepatitis C Ab: NONREACTIVE

## 2022-10-29 LAB — HIV ANTIBODY (ROUTINE TESTING W REFLEX): HIV 1&2 Ab, 4th Generation: NONREACTIVE

## 2022-10-30 ENCOUNTER — Other Ambulatory Visit: Payer: Self-pay

## 2022-10-30 DIAGNOSIS — Z1159 Encounter for screening for other viral diseases: Secondary | ICD-10-CM

## 2022-10-30 DIAGNOSIS — R7989 Other specified abnormal findings of blood chemistry: Secondary | ICD-10-CM

## 2022-11-13 ENCOUNTER — Other Ambulatory Visit (INDEPENDENT_AMBULATORY_CARE_PROVIDER_SITE_OTHER): Payer: BC Managed Care – PPO

## 2022-11-13 DIAGNOSIS — R7989 Other specified abnormal findings of blood chemistry: Secondary | ICD-10-CM

## 2022-11-13 LAB — COMPREHENSIVE METABOLIC PANEL
ALT: 25 U/L (ref 0–53)
AST: 31 U/L (ref 0–37)
Albumin: 4.3 g/dL (ref 3.5–5.2)
Alkaline Phosphatase: 62 U/L (ref 39–117)
BUN: 15 mg/dL (ref 6–23)
CO2: 27 mEq/L (ref 19–32)
Calcium: 9.3 mg/dL (ref 8.4–10.5)
Chloride: 103 mEq/L (ref 96–112)
Creatinine, Ser: 1.71 mg/dL — ABNORMAL HIGH (ref 0.40–1.50)
GFR: 53.53 mL/min — ABNORMAL LOW (ref >60.00)
Glucose, Bld: 75 mg/dL (ref 70–99)
Potassium: 3.9 mEq/L (ref 3.5–5.1)
Sodium: 137 mEq/L (ref 135–145)
Total Bilirubin: 0.8 mg/dL (ref 0.2–1.2)
Total Protein: 6.8 g/dL (ref 6.0–8.3)

## 2022-11-19 ENCOUNTER — Other Ambulatory Visit: Payer: Self-pay | Admitting: Family Medicine

## 2022-11-19 DIAGNOSIS — R7989 Other specified abnormal findings of blood chemistry: Secondary | ICD-10-CM

## 2022-11-20 ENCOUNTER — Telehealth: Payer: Self-pay | Admitting: Family Medicine

## 2022-11-20 NOTE — Telephone Encounter (Signed)
Pt is returning Aaron Clements call

## 2022-11-23 NOTE — Telephone Encounter (Signed)
Pt called back with more questions. (Re:  GI//US)  Please return his call.

## 2022-11-23 NOTE — Telephone Encounter (Signed)
Spoke with pt on 6/21, no further questions.

## 2022-11-25 NOTE — Telephone Encounter (Signed)
ATC pt. No answer. Left VM that I will try reaching out tomorrow.

## 2022-11-26 NOTE — Telephone Encounter (Addendum)
ATC pt, call was sent to VM.   Spoke with pt 6/28, questions and concerns were answered.

## 2022-12-01 ENCOUNTER — Ambulatory Visit: Payer: BC Managed Care – PPO

## 2022-12-14 ENCOUNTER — Ambulatory Visit
Admission: RE | Admit: 2022-12-14 | Discharge: 2022-12-14 | Disposition: A | Discharge status: Home / Self Care | Payer: BC Managed Care – PPO | Source: Ambulatory Visit | Attending: Family Medicine | Admitting: Family Medicine

## 2022-12-14 DIAGNOSIS — R7989 Other specified abnormal findings of blood chemistry: Secondary | ICD-10-CM

## 2022-12-21 ENCOUNTER — Other Ambulatory Visit: Payer: Self-pay | Admitting: Family Medicine

## 2022-12-21 DIAGNOSIS — R7989 Other specified abnormal findings of blood chemistry: Secondary | ICD-10-CM

## 2023-01-01 ENCOUNTER — Other Ambulatory Visit (INDEPENDENT_AMBULATORY_CARE_PROVIDER_SITE_OTHER): Payer: BC Managed Care – PPO

## 2023-01-01 DIAGNOSIS — R7989 Other specified abnormal findings of blood chemistry: Secondary | ICD-10-CM | POA: Diagnosis not present

## 2023-01-01 LAB — URINALYSIS, ROUTINE W REFLEX MICROSCOPIC
Bilirubin Urine: NEGATIVE
Hgb urine dipstick: NEGATIVE
Ketones, ur: NEGATIVE
Leukocytes,Ua: NEGATIVE
Nitrite: NEGATIVE
RBC / HPF: NONE SEEN (ref 0–?)
Specific Gravity, Urine: 1.02 (ref 1.000–1.030)
Total Protein, Urine: 30 — AB
Urine Glucose: NEGATIVE
Urobilinogen, UA: 0.2 (ref 0.0–1.0)
WBC, UA: NONE SEEN (ref 0–?)
pH: 7 (ref 5.0–8.0)

## 2023-01-01 LAB — BASIC METABOLIC PANEL
BUN: 15 mg/dL (ref 6–23)
CO2: 26 mEq/L (ref 19–32)
Calcium: 9.1 mg/dL (ref 8.4–10.5)
Chloride: 103 mEq/L (ref 96–112)
Creatinine, Ser: 1.53 mg/dL — ABNORMAL HIGH (ref 0.40–1.50)
GFR: 61.12 mL/min (ref >60.00)
Glucose, Bld: 83 mg/dL (ref 70–99)
Potassium: 4.2 mEq/L (ref 3.5–5.1)
Sodium: 135 mEq/L (ref 135–145)

## 2023-01-01 LAB — MICROALBUMIN / CREATININE URINE RATIO
Creatinine,U: 268.2 mg/dL
Microalb Creat Ratio: 0.3 mg/g (ref 0.0–30.0)
Microalb, Ur: <0.7 mg/dL (ref 0.0–1.9)

## 2023-04-21 ENCOUNTER — Ambulatory Visit: Payer: BC Managed Care – PPO | Admitting: Family Medicine

## 2023-04-21 ENCOUNTER — Encounter: Payer: Self-pay | Admitting: Family Medicine

## 2023-04-21 ENCOUNTER — Ambulatory Visit (INDEPENDENT_AMBULATORY_CARE_PROVIDER_SITE_OTHER): Payer: BC Managed Care – PPO

## 2023-04-21 VITALS — BP 122/70 | HR 53 | Temp 98.3°F | Ht 69.0 in | Wt 204.8 lb

## 2023-04-21 DIAGNOSIS — S93602A Unspecified sprain of left foot, initial encounter: Secondary | ICD-10-CM | POA: Diagnosis not present

## 2023-04-21 DIAGNOSIS — R600 Localized edema: Secondary | ICD-10-CM

## 2023-04-21 DIAGNOSIS — M79672 Pain in left foot: Secondary | ICD-10-CM | POA: Diagnosis not present

## 2023-04-21 NOTE — Patient Instructions (Signed)
Start ibuprofen, compression, elevation, ice.

## 2023-04-21 NOTE — Progress Notes (Signed)
Established Patient Office Visit   Subjective  Patient ID: Aaron Clements, male    DOB: March 04, 1996  Age: 27 y.o. MRN: [id number]  Chief Complaint  Patient presents with   Foot Injury    Patient came in today for a left foot injury, patient states he stepped off of a curb and rolled his foot 2 weeks ago, there is some swelling and pain, rate ofpain 4 out of 10, dull     Patient is a 27 year old male seen for acute concern.  Pt with L lateral foot pain x 2 wks s/p rolling ankle outward while stepping off a curb trying to catch his dog.  Patient was able to bear weight immediately after injury and endorses pain while doing so.  Icing foot.  Has some pain with walking.  Trying to wear a firmer shoe.  Continued edema of lateral foot.  Pt just happens to be on vacation until the 28th.    Foot Injury     Patient Active Problem List   Diagnosis Date Noted   Mild concussion 01/08/2017   Constipation 12/19/2012   Past Medical History:  Diagnosis Date   Headache    Migraines    History reviewed. No pertinent surgical history. Social History   Tobacco Use   Smoking status: Never   Smokeless tobacco: Never  Vaping Use   Vaping status: Never Used  Substance Use Topics   Alcohol use: No   Drug use: No   Family History  Problem Relation Age of Onset   Colon cancer Maternal Grandmother    Cancer Maternal Grandmother    Prostate cancer Maternal Grandfather    Cancer Maternal Grandfather    No Known Allergies    ROS Negative unless stated above    Objective:     BP 122/70 (BP Location: Left Arm, Patient Position: Sitting, Cuff Size: Large)   Pulse (!) 53   Temp 98.3 F (36.8 C) (Oral)   Ht 5\' 9"  (1.753 m)   Wt 204 lb 12.8 oz (92.9 kg)   SpO2 98%   BMI 30.24 kg/m  BP Readings from Last 3 Encounters:  04/21/23 122/70  10/28/22 110/84  02/25/22 132/70   Wt Readings from Last 3 Encounters:  04/21/23 204 lb 12.8 oz (92.9 kg)  10/28/22 205 lb 12.8 oz (93.4 kg)   02/25/22 200 lb (90.7 kg)      Physical Exam Constitutional:      General: He is not in acute distress.    Appearance: Normal appearance.  HENT:     Head: Normocephalic and atraumatic.     Nose: Nose normal.     Mouth/Throat:     Mouth: Mucous membranes are moist.  Cardiovascular:     Rate and Rhythm: Normal rate and regular rhythm.     Heart sounds: Normal heart sounds. No murmur heard.    No gallop.  Pulmonary:     Effort: Pulmonary effort is normal. No respiratory distress.     Breath sounds: Normal breath sounds. No wheezing, rhonchi or rales.  Musculoskeletal:     Right foot: Normal.     Left foot: Bony tenderness present.     Comments: Firm area of edema of left lateral foot at distal fifth metatarsal.  Normal ROM of left ankle and toes.  No pain with plantar flexion or dorsiflexion.  Skin:    General: Skin is warm and dry.  Neurological:     Mental Status: He is alert and oriented to  person, place, and time.     No results found for any visits on 04/21/23.    Assessment & Plan:  Left foot pain -     DG Foot Complete Left  Edema of left foot -     DG Foot Complete Left  Sprain of left foot, initial encounter  Patient with continued left lateral foot pain status post inversion 2 weeks ago.  Concern for sprain versus fracture.  Obtain imaging of foot.  Discussed supportive care including rest, elevation, compression, ice, NSAIDs, supportive shoe, etc.  Discussed likely duration of symptoms.  Given strict precautions.  Return if symptoms worsen or fail to improve.   Deeann Saint, MD

## 2023-08-04 ENCOUNTER — Ambulatory Visit: Admitting: Family Medicine

## 2023-08-04 ENCOUNTER — Encounter: Payer: Self-pay | Admitting: Family Medicine

## 2023-08-04 VITALS — BP 120/80 | HR 62 | Temp 98.1°F | Resp 12 | Ht 69.0 in | Wt 209.0 lb

## 2023-08-04 DIAGNOSIS — J069 Acute upper respiratory infection, unspecified: Secondary | ICD-10-CM

## 2023-08-04 DIAGNOSIS — R059 Cough, unspecified: Secondary | ICD-10-CM | POA: Diagnosis not present

## 2023-08-04 LAB — POC COVID19 BINAXNOW: SARS Coronavirus 2 Ag: NEGATIVE

## 2023-08-04 LAB — POCT INFLUENZA A/B
Influenza A, POC: NEGATIVE
Influenza B, POC: NEGATIVE

## 2023-08-04 MED ORDER — FLUTICASONE PROPIONATE 50 MCG/ACT NA SUSP
1.0000 | Freq: Two times a day (BID) | NASAL | 0 refills | Status: DC
Start: 2023-08-04 — End: 2023-08-12

## 2023-08-04 MED ORDER — BENZONATATE 100 MG PO CAPS: 100.0000 mg | ORAL_CAPSULE | Freq: Two times a day (BID) | ORAL | 0 refills | Status: DC | PRN

## 2023-08-04 NOTE — Progress Notes (Signed)
 ACUTE VISIT Chief Complaint  Patient presents with   chest congestion   Generalized Body Aches    Started Sunday-Monday   HPI: [name] is a 28 y.o. male here today with above complaints.  He complains of left-sided frontal headache, nasal congestion, rhinorrhea, sore throat,and productive cough.  His sore throat resolved yesterday.  Cough This is a new problem. The current episode started in the past 7 days. The problem has been unchanged. The problem occurs every few hours. The cough is Productive of sputum. Associated symptoms include chills and myalgias. Pertinent negatives include no chest pain, ear congestion, heartburn, hemoptysis, rash or weight loss. Nothing aggravates the symptoms. He has tried OTC cough suppressant for the symptoms. The treatment provided mild relief. There is no history of asthma or environmental allergies.   He believes he has had some recent sick contacts.   He has been taking Mucinex for his symptoms.  Pertinent negatives include fever, ear ache, SOB, or wheezing.  Has not checked temp.  Review of Systems  Constitutional:  Positive for chills. Negative for weight loss.  HENT:  Negative for facial swelling and mouth sores.   Respiratory:  Positive for cough. Negative for hemoptysis.   Cardiovascular:  Negative for chest pain.  Gastrointestinal:  Negative for abdominal pain, heartburn, nausea and vomiting.  Genitourinary:  Negative for decreased urine volume, dysuria and hematuria.  Musculoskeletal:  Positive for myalgias. Negative for neck pain.  Skin:  Negative for rash.  Allergic/Immunologic: Negative for environmental allergies.  Neurological:  Negative for syncope, facial asymmetry and weakness.  See other pertinent positives and negatives in HPI.  Current Outpatient Medications on File Prior to Visit  Medication Sig Dispense Refill   Acetaminophen (TYLENOL PO) Take by mouth as needed.     aspirin-acetaminophen-caffeine  (EXCEDRIN MIGRAINE) 250-250-65 MG tablet Take 2 tablets by mouth every 6 (six) hours as needed for headache.     No current facility-administered medications on file prior to visit.    Past Medical History:  Diagnosis Date   Headache    Migraines    No Known Allergies  Social History   Socioeconomic History   Marital status: Single    Spouse name: Not on file   Number of children: 0   Years of education: 15   Highest education level: Not on file  Occupational History   Occupation: student    Comment: [employer], criminal justice   Occupation: Buyer, retail: [employer]  Tobacco Use   Smoking status: Never   Smokeless tobacco: Never  Vaping Use   Vaping status: Never Used  Substance and Sexual Activity   Alcohol use: No   Drug use: No   Sexual activity: Not on file  Other Topics Concern   Not on file  Social History Narrative   Lives with mother   No caffeine   Social Drivers of Corporate investment banker Strain: Not on file  Food Insecurity: Not on file  Transportation Needs: Not on file  Physical Activity: Not on file  Stress: Not on file  Social Connections: Not on file    Vitals:   08/04/23 1127  BP: 120/80  Pulse: 62  Resp: 12  Temp: 98.1 F (36.7 C)  SpO2: 97%   Body mass index is 30.86 kg/m.  Physical Exam Constitutional:      General: He is not in acute distress.    Appearance: He is well-developed. He is not ill-appearing.  HENT:     Head: Normocephalic and atraumatic.     Right Ear: External ear normal.     Left Ear: External ear normal. Tympanic membrane is not erythematous.     Ears:     Comments: Bilateral ear canal aches cerumen, R>L. Could see left TM partially.    Nose: Congestion and rhinorrhea present.     Right Turbinates: Enlarged.     Left Turbinates: Enlarged.     Left Sinus: No maxillary sinus tenderness.     Mouth/Throat:     Mouth: Mucous membranes are moist.     Pharynx: Uvula midline. Postnasal drip present. No  posterior oropharyngeal erythema.  Eyes:     Conjunctiva/sclera: Conjunctivae normal.  Cardiovascular:     Rate and Rhythm: Normal rate and regular rhythm.     Heart sounds: No murmur heard. Pulmonary:     Effort: Pulmonary effort is normal. No respiratory distress.     Breath sounds: Normal breath sounds. No stridor.  Lymphadenopathy:     Head:     Right side of head: No submandibular adenopathy.     Left side of head: No submandibular adenopathy.     Cervical: No cervical adenopathy.  Skin:    General: Skin is warm.     Findings: No erythema or rash.  Neurological:     Mental Status: He is alert and oriented to person, place, and time.  Psychiatric:        Mood and Affect: Mood and affect normal.   ASSESSMENT AND PLAN:  Mr. Aaron Clements was seen today for cold and body aches.   URI, acute Symptoms suggests a viral etiology, symptoms have been going on for 3 to 4 days, he might still feel sick for a couple days and then I am expecting symptoms to start getting better. Tested negative for Covid and Flu today in the office today.  I explained that symptomatic treatment is usually recommended in this case, so I do not think abx is needed at this time. He can use OTC DayQuil and NyQuil for symptoms management. Adequate hydration and rest. Nasal saline irrigations as needed throughout the day and Flonase nasal spray daily for 10 to 14 days to help with nasal congestion. He does not want a note for work. Continue monitoring temperature.  -     Fluticasone Propionate; Place 1 spray into both nostrils 2 (two) times daily.  Dispense: 16 g; Refill: 0  Cough, unspecified type Explained that cough and nasal congestion can last a few days and sometimes weeks. Lung auscultation is normal today, so I do not think imaging is needed at this time. Monitor for new symptoms. Instructed about warning signs. Benzonatate for cough management recommended.  -     POC COVID-19 BinaxNow -     POCT  Influenza A/B -     Benzonatate; Take 1 capsule (100 mg total) by mouth 2 (two) times daily as needed for up to 10 days.  Dispense: 20 capsule; Refill: 0   Return if symptoms worsen or fail to improve.  I, Rolla Etienne Wierda, acting as a scribe for Hatley Henegar [place], MD., have documented all relevant documentation on the behalf of Lianni Kanaan [place], MD, as directed by  Gwenette Wellons [place], MD while in the presence of Sheccid Lahmann [place], MD.   I, Lezlie Ritchey [place], MD, have reviewed all documentation for this visit. The documentation on 08/04/23 for the exam, diagnosis, procedures, and orders are all accurate and complete.  Treven Holtman G. [place], MD  [facility]. [place] office.

## 2023-08-04 NOTE — Patient Instructions (Addendum)
 A few things to remember from today's visit:  URI, acute - Plan: fluticasone (FLONASE) 50 MCG/ACT nasal spray  Cough, unspecified type - Plan: POC COVID-19, POCT Influenza A/B, benzonatate (TESSALON) 100 MG capsule  viral infections are self-limited and we treat each symptom depending of severity.  Over the counter medications as decongestants and cold medications usually help, they need to be taken with caution if there is a history of high blood pressure or palpitations. Tylenol and/or Ibuprofen also helps with most symptoms (headache, muscle aching, fever,etc) Plenty of fluids. Honey helps with cough. Steam inhalations helps with runny nose, nasal congestion, and may prevent sinus infections. Cough and nasal congestion could last a few days and sometimes weeks. Flonase nasal spray 1-2 sprays daily for 10-14 days.  Please follow in not any better in 1-2 weeks or if symptoms get worse.  Do not use My Chart to request refills or for acute issues that need immediate attention. If you send a my chart message, it may take a few days to be addressed, specially if I am not in the office.  Please be sure medication list is accurate. If a new problem present, please set up appointment sooner than planned today.

## 2023-08-12 ENCOUNTER — Encounter: Payer: Self-pay | Admitting: Family Medicine

## 2023-08-12 ENCOUNTER — Ambulatory Visit: Payer: Self-pay | Admitting: Family Medicine

## 2023-08-12 ENCOUNTER — Ambulatory Visit: Admitting: Family Medicine

## 2023-08-12 VITALS — BP 120/74 | HR 52 | Temp 97.9°F | Ht 69.0 in | Wt 205.0 lb

## 2023-08-12 DIAGNOSIS — R1110 Vomiting, unspecified: Secondary | ICD-10-CM

## 2023-08-12 DIAGNOSIS — R519 Headache, unspecified: Secondary | ICD-10-CM

## 2023-08-12 LAB — POC COVID19 BINAXNOW: SARS Coronavirus 2 Ag: NEGATIVE

## 2023-08-12 LAB — POCT INFLUENZA A/B
Influenza A, POC: NEGATIVE
Influenza B, POC: NEGATIVE

## 2023-08-12 MED ORDER — PREDNISONE 10 MG PO TABS: ORAL_TABLET | ORAL | 0 refills | Status: AC

## 2023-08-12 MED ORDER — ONDANSETRON 4 MG PO TBDP: 4.0000 mg | ORAL_TABLET | Freq: Three times a day (TID) | ORAL | 0 refills | Status: DC | PRN

## 2023-08-12 NOTE — Telephone Encounter (Signed)
 Called patient left a VM to return call to sch with a provider in the office

## 2023-08-12 NOTE — Patient Instructions (Addendum)
-  Negative rapid covid and influenza. -Prescribed Prednisone 10mg , 6 day taper for headache. Please do not take any NSAIDS, such as Ibuprofen, Motrin, Aleve, or Naproxen.  -Prescribed Zofran 4mg  ODT, every 8 hours as needed for nausea and vomiting.  -Recommend to hydrate 64-100oz of water.  -Follow up if not improved or symptoms become worse.

## 2023-08-12 NOTE — Telephone Encounter (Signed)
 Chief Complaint: Vomiting Symptoms: HA Frequency: Today AM Pertinent Negatives: Patient denies fever, abd pain Disposition: [] ED /[] Urgent Care (no appt availability in office) / [x] Appointment(In office/virtual)/ []  [facility]/ [] Home Care/ [] Refused Recommended Disposition /[] [facility]/ []  Follow-up with PCP Additional Notes: Pt reports 4 episodes of green emesis this AM, 1 large episode of "very soft" stool, and reports HA of 5/10. Pt denies fever. OV scheduled per protocol. This RN educated pt on home care, new-worsening symptoms, when to call back/seek emergent care. Pt verbalized understanding and agrees to plan.    Copied from CRM [id number]. Topic: Clinical - Red Word Triage >> Aug 12, 2023 12:01 PM Elizebeth Brooking wrote: Red Word that prompted transfer to Nurse Triage: Patient called in last week, was tested negative for the flu and covid. Woke up this morning vomiting and using the restroom and has a headache. Would like to speak to a nurse on what he should do about it Reason for Disposition  [1] Vomiting AND [2] contains bile (green color)  Answer Assessment - Initial Assessment Questions 1. VOMITING SEVERITY: "How many times have you vomited in the past 24 hours?"     - MILD:  1 - 2 times/day    - MODERATE: 3 - 5 times/day, decreased oral intake without significant weight loss or symptoms of dehydration    - SEVERE: 6 or more times/day, vomits everything or nearly everything, with significant weight loss, symptoms of dehydration      4 2. ONSET: "When did the vomiting begin?"      Woke up this AM with HA and continued to worsen 3. FLUIDS: "What fluids or food have you vomited up today?" "Have you been able to keep any fluids down?"     Trying but pt reports not keeping it down 4. ABDOMEN PAIN: "Are your having any abdomen pain?" If Yes : "How bad is it and what does it feel like?" (e.g., crampy, dull, intermittent, constant)      With vomiting, cramping 5.  DIARRHEA: "Is there any diarrhea?" If Yes, ask: "How many times today?"      Soft stool x1 6. CONTACTS: "Is there anyone else in the family with the same symptoms?"      None 7. CAUSE: "What do you think is causing your vomiting?"     Unknown 8. HYDRATION STATUS: "Any signs of dehydration?" (e.g., dry mouth [not only dry lips], too weak to stand) "When did you last urinate?"     Dry mouth 9. OTHER SYMPTOMS: "Do you have any other symptoms?" (e.g., fever, headache, vertigo, vomiting blood or coffee grounds, recent head injury)     HA  Protocols used: Vomiting-A-AH

## 2023-08-12 NOTE — Progress Notes (Signed)
 Acute Office Visit   Subjective:  Patient ID: Aaron Clements, male    DOB: Mar 06, 1996, 28 y.o.   MRN: [id number]  Chief Complaint  Patient presents with   Headache    Pt reports he woke up this morning with headache on L side. With vomitting and loose bowel movement. Felts chills earlier and feeling tired. Pt states he just got over his cold from last week. And still has lingering cough.     HPI:  Patient is present for an acute visit. Patient reports he woke up with a headache, nausea, vomiting, and diarrhea. Patient reports his headache is located left sided. Constant, throbbing. Denies dizziness or lightheadedness. Abd cramping with vomiting. Also, reports chills and feeling fatigued. He reports his nasal congestion has improved since having a diagnosis of URI on 03/05. He as seen by Dr. [place] on 03/05, negative for influenza and covid. Prescribed Benzonatate and Flonase. He reports he didn't use the medication to help with his symptoms.   Denies fever, body aches, sore throat, ear pain or drainage, CP, SHOB, or cough.   Denies taking medication for symptoms. He reports he has history of headaches, similar to his usual headaches. Usually takes Excedrin, Tylenol, or Ibuprofen, and Magnesium. Not tried any of these medications today.  Review of Systems  Neurological:  Positive for headaches.   See HPI above      Objective:   BP 120/74 (BP Location: Right Arm, Patient Position: Sitting, Cuff Size: Large)   Pulse (!) 52   Temp 97.9 F (36.6 C) (Oral)   Ht 5\' 9"  (1.753 m)   Wt 205 lb (93 kg)   SpO2 97%   BMI 30.27 kg/m    Physical Exam Vitals reviewed.  Constitutional:      General: He is not in acute distress.    Appearance: Normal appearance. He is well-developed. He is not ill-appearing, toxic-appearing or diaphoretic.  HENT:     Head: Normocephalic and atraumatic.     Right Ear: There is impacted cerumen.     Left Ear: Tympanic membrane, ear canal and external  ear normal. There is no impacted cerumen.     Nose:     Right Sinus: No maxillary sinus tenderness or frontal sinus tenderness.     Left Sinus: No maxillary sinus tenderness or frontal sinus tenderness.     Mouth/Throat:     Pharynx: Oropharynx is clear. Uvula midline. No pharyngeal swelling, oropharyngeal exudate, posterior oropharyngeal erythema or uvula swelling.  Eyes:     General:        Right eye: No discharge.        Left eye: No discharge.     Conjunctiva/sclera: Conjunctivae normal.  Cardiovascular:     Rate and Rhythm: Normal rate and regular rhythm.     Heart sounds: Normal heart sounds. No murmur heard.    No friction rub. No gallop.  Pulmonary:     Effort: Pulmonary effort is normal. No respiratory distress.     Breath sounds: Normal breath sounds.  Musculoskeletal:        General: Normal range of motion.  Skin:    General: Skin is warm and dry.  Neurological:     General: No focal deficit present.     Mental Status: He is alert and oriented to person, place, and time. Mental status is at baseline.     Cranial Nerves: Cranial nerves 2-12 are intact. No facial asymmetry.     Sensory: Sensation is  intact.     Motor: No weakness.     Gait: Gait is intact.  Psychiatric:        Mood and Affect: Mood normal.        Behavior: Behavior normal.        Thought Content: Thought content normal.        Judgment: Judgment normal.     Results for orders placed or performed in visit on 08/12/23  POC COVID-19  Result Value Ref Range   SARS Coronavirus 2 Ag Negative Negative  POC Influenza A/B  Result Value Ref Range   Influenza A, POC Negative Negative   Influenza B, POC Negative Negative        Assessment & Plan:  Nonintractable headache, unspecified chronicity pattern, unspecified headache type -     POC COVID-19 BinaxNow -     POCT Influenza A/B -     predniSONE; Take 6 tablets (60 mg total) by mouth daily with breakfast for 1 day, THEN 5 tablets (50 mg total)  daily with breakfast for 1 day, THEN 4 tablets (40 mg total) daily with breakfast for 1 day, THEN 3 tablets (30 mg total) daily with breakfast for 1 day, THEN 2 tablets (20 mg total) daily with breakfast for 1 day, THEN 1 tablet (10 mg total) daily with breakfast for 1 day.  Dispense: 21 tablet; Refill: 0  Vomiting, unspecified vomiting type, unspecified whether nausea present -     POC COVID-19 BinaxNow -     POCT Influenza A/B -     Ondansetron; Take 1 tablet (4 mg total) by mouth every 8 (eight) hours as needed for nausea or vomiting.  Dispense: 20 tablet; Refill: 0  -Negative rapid covid and influenza. -Prescribed Prednisone 10mg , 6 day taper for headache. Advised do not take any NSAIDS, such as Ibuprofen, Motrin, Aleve, or Naproxen.  -Prescribed Zofran 4mg  ODT, every 8 hours as needed for nausea and vomiting.  -Recommend to hydrate 64-100oz of water.  -Follow up if not improved or symptoms become worse.   Zandra Abts, NP

## 2024-03-20 ENCOUNTER — Telehealth: Payer: Self-pay

## 2024-03-20 NOTE — Telephone Encounter (Signed)
 Copied from CRM #[id number]. Topic: Clinical - Medication Question >> Mar 20, 2024  9:50 AM Sophia H wrote: Reason for CRM:  Patient is wanting to know when his last tetanus shot was - please advise. # [phone]

## 2024-03-20 NOTE — Telephone Encounter (Signed)
 Patient had a tdap 03/02/2022

## 2024-03-23 ENCOUNTER — Ambulatory Visit: Admitting: Family Medicine

## 2024-03-23 ENCOUNTER — Encounter: Payer: Self-pay | Admitting: Family Medicine

## 2024-03-23 VITALS — BP 110/68 | HR 55 | Temp 98.1°F | Ht 69.0 in | Wt 205.0 lb

## 2024-03-23 DIAGNOSIS — Z23 Encounter for immunization: Secondary | ICD-10-CM | POA: Diagnosis not present

## 2024-03-23 DIAGNOSIS — R7989 Other specified abnormal findings of blood chemistry: Secondary | ICD-10-CM

## 2024-03-23 DIAGNOSIS — N1831 Chronic kidney disease, stage 3a: Secondary | ICD-10-CM

## 2024-03-23 LAB — POCT URINALYSIS DIPSTICK
Bilirubin, UA: NEGATIVE
Blood, UA: NEGATIVE
Glucose, UA: NEGATIVE
Ketones, UA: NEGATIVE
Leukocytes, UA: NEGATIVE
Nitrite, UA: NEGATIVE
Protein, UA: NEGATIVE
Spec Grav, UA: 1.015 (ref 1.010–1.025)
Urobilinogen, UA: 0.2 U/dL
pH, UA: 7 (ref 5.0–8.0)

## 2024-03-23 LAB — MICROALBUMIN / CREATININE URINE RATIO
Creatinine,U: 142.4 mg/dL
Microalb Creat Ratio: UNDETERMINED mg/g (ref 0.0–30.0)
Microalb, Ur: <0.7 mg/dL

## 2024-03-23 LAB — URINALYSIS, MICROSCOPIC ONLY
RBC / HPF: NONE SEEN (ref 0–?)
WBC, UA: NONE SEEN (ref 0–?)

## 2024-03-23 NOTE — Progress Notes (Addendum)
 Established Patient Office Visit   Subjective  Patient ID: Aaron Clements, male    DOB: 1995-11-13  Age: 28 y.o. MRN: [id number]  Chief Complaint  Patient presents with   Medical Management of Chronic Issues    Patient came in today for Lab results taken at the patient's job     Pt is a 28 yo male with pmh sig for CKD III seen for f/u on renal fnx and labs.  Pt had labs with employer, creatinine 1.60 and eGFR 59 on 03/15/24.  Advised to f/u with pcp.  Pt eating the same foods most days, but notes an increase in red meat.  Eating broccoli, potatoes, rice, chicken, watermelon,3 eggs.  Exercising regularly.  Using supplements  bulk transparent labs pre workout, boat milk chocolate protein with bananas, blueberries, and malt milk.  Also using pure creatinine monohydrate performance once a week, magnesium, vitamin D3, K2.  Drinking 90 ounces water per day.  Patient denies symptoms or regular NSAIDs use.  Did use NSAIDs with recent finger dislocation(s).  Last year renal workup included ultrasound on 12/14/2022 in addition to labs.  No family history of autoimmune disorders or renal disease.   Pt is a Estate manager/land agent.  Patient Active Problem List   Diagnosis Date Noted   Stage 3a chronic kidney disease (HCC) 03/23/2024   Mild concussion 01/08/2017   Constipation 12/19/2012   Past Medical History:  Diagnosis Date   Headache    Migraines    History reviewed. No pertinent surgical history. Social History   Tobacco Use   Smoking status: Never   Smokeless tobacco: Never  Vaping Use   Vaping status: Never Used  Substance Use Topics   Alcohol use: No   Drug use: No   Family History  Problem Relation Age of Onset   Colon cancer Maternal Grandmother    Cancer Maternal Grandmother    Prostate cancer Maternal Grandfather    Cancer Maternal Grandfather    No Known Allergies  ROS Negative unless stated above    Objective:     BP 110/68 (BP Location: Left Arm, Patient  Position: Sitting, Cuff Size: Large)   Pulse (!) 55   Temp 98.1 F (36.7 C) (Oral)   Ht 5' 9 (1.753 m)   Wt 205 lb (93 kg)   SpO2 98%   BMI 30.27 kg/m  BP Readings from Last 3 Encounters:  03/23/24 110/68  08/12/23 120/74  08/04/23 120/80   Wt Readings from Last 3 Encounters:  03/23/24 205 lb (93 kg)  08/12/23 205 lb (93 kg)  08/04/23 209 lb (94.8 kg)     Physical Exam Constitutional:      General: He is not in acute distress.    Appearance: Normal appearance.  HENT:     Head: Normocephalic and atraumatic.     Nose: Nose normal.     Mouth/Throat:     Mouth: Mucous membranes are moist.  Cardiovascular:     Rate and Rhythm: Normal rate and regular rhythm.     Heart sounds: Normal heart sounds. No murmur heard.    No gallop.  Pulmonary:     Effort: Pulmonary effort is normal. No respiratory distress.     Breath sounds: Normal breath sounds. No wheezing, rhonchi or rales.  Skin:    General: Skin is warm and dry.  Neurological:     Mental Status: He is alert and oriented to person, place, and time.        03/23/2024  10:37 AM 04/21/2023    3:38 PM 02/25/2022    3:40 PM  Depression screen PHQ 2/9  Decreased Interest 0 0 0  Down, Depressed, Hopeless 0 0 0  PHQ - 2 Score 0 0 0  Altered sleeping 0 0 0  Tired, decreased energy 0 0 0  Change in appetite 0 0 1  Feeling bad or failure about yourself  0 0 0  Trouble concentrating 0 0 0  Moving slowly or fidgety/restless 0 0 0  Suicidal thoughts 0 0 0  PHQ-9 Score 0 0 1  Difficult doing work/chores   Not difficult at all      03/23/2024   10:38 AM 04/21/2023    3:38 PM  GAD 7 : Generalized Anxiety Score  Nervous, Anxious, on Edge 0 0  Control/stop worrying 0 0  Worry too much - different things 0 1  Trouble relaxing 0 0  Restless 0 0  Easily annoyed or irritable 0 0  Afraid - awful might happen 0 0  Total GAD 7 Score 0 1  Anxiety Difficulty  Not difficult at all    Results for orders placed or performed  in visit on 03/23/24  Microalbumin/Creatinine Ratio, Urine  Result Value Ref Range   Microalb, Ur <0.7 mg/dL   Creatinine,U 857.5 mg/dL   Microalb Creat Ratio Unable to calculate 0.0 - 30.0 mg/g  Urine Microscopic Only  Result Value Ref Range   WBC, UA none seen 0-2/hpf   RBC / HPF none seen 0-2/hpf   Squamous Epithelial / HPF Rare(0-4/hpf) Rare(0-4/hpf)  POCT urinalysis dipstick  Result Value Ref Range   Color, UA yellow    Clarity, UA clear    Glucose, UA Negative Negative   Bilirubin, UA neg    Ketones, UA neg    Spec Grav, UA 1.015 1.010 - 1.025   Blood, UA neg    pH, UA 7.0 5.0 - 8.0   Protein, UA Negative Negative   Urobilinogen, UA 0.2 0.2 or 1.0 E.U./dL   Nitrite, UA neg    Leukocytes, UA Negative Negative   Appearance     Odor        Assessment & Plan:   Elevated serum creatinine -     Ambulatory referral to Nephrology -     POCT urinalysis dipstick -     Protein / creatinine ratio, urine -     Microalbumin / creatinine urine ratio -     Urine Microscopic  Need for influenza vaccination -     Flu vaccine trivalent PF, 6mos and older(Flulaval,Afluria,Fluarix,Fluzone)  Stage 3a chronic kidney disease (HCC) -     Ambulatory referral to Nephrology -     Protein / creatinine ratio, urine -     Microalbumin / creatinine urine ratio -     Urine Microscopic  Chronic creatinine elevation.  Baseline creatinine ~ 1.5-1.7.  Renal ultrasound on 12/14/2022 with mildly dilated right upper calyces otherwise normal.  Discussed obtaining additional urine studies.  Pt advised to d'c all supplements.  Recheck CMP in 1-2 wks.  Referral to Nephrology.  Will review supplement content.  Consider CT.  Return in about 4 weeks (around 04/20/2024).   Clotilda JONELLE Single, MD

## 2024-03-24 LAB — PROTEIN / CREATININE RATIO, URINE
Creatinine, Urine: 155 mg/dL (ref 20–320)
Protein/Creat Ratio: 45 mg/g{creat} (ref 25–148)
Protein/Creatinine Ratio: 0.045 mg/mg{creat} (ref 0.025–0.148)
Total Protein, Urine: 7 mg/dL (ref 5–25)

## 2024-03-30 ENCOUNTER — Ambulatory Visit: Payer: Self-pay | Admitting: Family Medicine

## 2024-04-14 LAB — LAB REPORT - SCANNED: EGFR: 62

## 2024-04-20 ENCOUNTER — Ambulatory Visit: Admitting: Family Medicine

## 2024-04-21 ENCOUNTER — Ambulatory Visit: Admitting: Family Medicine

## 2024-04-21 DIAGNOSIS — N1831 Chronic kidney disease, stage 3a: Secondary | ICD-10-CM

## 2024-04-24 ENCOUNTER — Encounter: Payer: Self-pay | Admitting: Family Medicine

## 2024-04-24 ENCOUNTER — Ambulatory Visit: Admitting: Family Medicine

## 2024-04-24 VITALS — BP 130/84 | HR 45 | Temp 97.8°F | Ht 69.0 in | Wt 205.0 lb

## 2024-04-24 DIAGNOSIS — N1831 Chronic kidney disease, stage 3a: Secondary | ICD-10-CM

## 2024-04-24 NOTE — Progress Notes (Signed)
 "  Established Patient Office Visit   Subjective  Patient ID: Aaron Clements, male    DOB: [birth year]  Age: 28 y.o. MRN: [id number]  Chief Complaint  Patient presents with   Medical Management of Chronic Issues    Patient came in today for a follow-up for stage 3 CKD, CMP recheck. Patient stopped supplements only taking Relyte     Pt is a 28 yo male seen for f/u on renal function.  Initially noted a yr or so ago.  Creat 1.54 and GFR 62 on 04/14/24.  Seen by Nephrology.  Advised to stop supplements.  Cut down on most, but still taking bulk lab post work out and fitaide recovery.  Drinking more water.  Eating mostly the same foods each day such as a protein and broccoli.      Patient Active Problem List   Diagnosis Date Noted   Stage 3a chronic kidney disease (HCC) 03/23/2024   Mild concussion 01/08/2017   Constipation 12/19/2012   Past Medical History:  Diagnosis Date   Headache    Migraines    History reviewed. No pertinent surgical history. Social History   Tobacco Use   Smoking status: Never   Smokeless tobacco: Never  Vaping Use   Vaping status: Never Used  Substance Use Topics   Alcohol use: No   Drug use: No   Family History  Problem Relation Age of Onset   Colon cancer Maternal Grandmother    Cancer Maternal Grandmother    Prostate cancer Maternal Grandfather    Cancer Maternal Grandfather    No Known Allergies  ROS Negative unless stated above    Objective:     BP 130/84 (BP Location: Left Arm, Patient Position: Sitting, Cuff Size: Large)   Pulse (!) 45   Temp 97.8 F (36.6 C) (Oral)   Ht 5' 9 (1.753 m)   Wt 205 lb (93 kg)   SpO2 99%   BMI 30.27 kg/m  BP Readings from Last 3 Encounters:  04/24/24 130/84  03/23/24 110/68  08/12/23 120/74   Wt Readings from Last 3 Encounters:  04/24/24 205 lb (93 kg)  03/23/24 205 lb (93 kg)  08/12/23 205 lb (93 kg)      Physical Exam Constitutional:      General: He is not in acute distress.     Appearance: Normal appearance.  HENT:     Head: Normocephalic and atraumatic.     Nose: Nose normal.     Mouth/Throat:     Mouth: Mucous membranes are moist.  Cardiovascular:     Rate and Rhythm: Normal rate and regular rhythm.     Heart sounds: Normal heart sounds. No murmur heard.    No gallop.  Pulmonary:     Effort: Pulmonary effort is normal. No respiratory distress.     Breath sounds: Normal breath sounds. No wheezing, rhonchi or rales.  Skin:    General: Skin is warm and dry.  Neurological:     Mental Status: He is alert and oriented to person, place, and time.        04/24/2024   11:05 AM 03/23/2024   10:37 AM 04/21/2023    3:38 PM  Depression screen PHQ 2/9  Decreased Interest 0 0 0  Down, Depressed, Hopeless 0 0 0  PHQ - 2 Score 0 0 0  Altered sleeping 0 0 0  Tired, decreased energy 0 0 0  Change in appetite 0 0 0  Feeling bad or failure about  yourself  0 0 0  Trouble concentrating 0 0 0  Moving slowly or fidgety/restless 0 0 0  Suicidal thoughts 0 0 0  PHQ-9 Score 0 0  0      Data saved with a previous flowsheet row definition      04/24/2024   11:05 AM 03/23/2024   10:38 AM 04/21/2023    3:38 PM  GAD 7 : Generalized Anxiety Score  Nervous, Anxious, on Edge 0 0 0  Control/stop worrying 0 0 0  Worry too much - different things 1 0 1  Trouble relaxing  0 0  Restless 0 0 0  Easily annoyed or irritable 0 0 0  Afraid - awful might happen 0 0 0  Total GAD 7 Score  0 1  Anxiety Difficulty Not difficult at all  Not difficult at all     No results found for any visits on 04/24/24.    Assessment & Plan:   Stage 3a chronic kidney disease (HCC) -     Comprehensive metabolic panel with GFR; Future  Creat 1.54 and GFR 62 on 04/14/24.  Continue f/u with Nephrology.  Renal u/s without acute abnormality, mild dilation of R upper pole calices on 12/14/22.  Advised to continue hydration.  Cessation of all supplements advised.  Clear sports drinks/water ok.   Recheck labs in Jan 2026.  Order placed.     Return in about 2 months (around 06/24/2024).   Clotilda JONELLE Single, MD "

## 2024-06-05 ENCOUNTER — Other Ambulatory Visit (INDEPENDENT_AMBULATORY_CARE_PROVIDER_SITE_OTHER)

## 2024-06-05 DIAGNOSIS — N1831 Chronic kidney disease, stage 3a: Secondary | ICD-10-CM | POA: Diagnosis not present

## 2024-06-05 LAB — COMPREHENSIVE METABOLIC PANEL WITH GFR
ALT: 24 U/L (ref 3–53)
AST: 24 U/L (ref 5–37)
Albumin: 4.3 g/dL (ref 3.5–5.2)
Alkaline Phosphatase: 80 U/L (ref 39–117)
BUN: 20 mg/dL (ref 6–23)
CO2: 28 meq/L (ref 19–32)
Calcium: 8.8 mg/dL (ref 8.4–10.5)
Chloride: 103 meq/L (ref 96–112)
Creatinine, Ser: 1.46 mg/dL (ref 0.40–1.50)
GFR: 64.01 mL/min
Glucose, Bld: 78 mg/dL (ref 70–99)
Potassium: 4.3 meq/L (ref 3.5–5.1)
Sodium: 136 meq/L (ref 135–145)
Total Bilirubin: 0.6 mg/dL (ref 0.2–1.2)
Total Protein: 6.8 g/dL (ref 6.0–8.3)

## 2024-06-07 ENCOUNTER — Ambulatory Visit: Payer: Self-pay | Admitting: Family Medicine
